# Patient Record
Sex: Female | Born: 1975
Health system: Southern US, Community
[De-identification: ages and names within clinical notes are randomized; demographics above are authoritative.]

## PROBLEM LIST (undated history)

## (undated) DIAGNOSIS — R569 Unspecified convulsions: Secondary | ICD-10-CM

## (undated) DIAGNOSIS — N200 Calculus of kidney: Secondary | ICD-10-CM

## (undated) DIAGNOSIS — J45909 Unspecified asthma, uncomplicated: Secondary | ICD-10-CM

## (undated) DIAGNOSIS — M1990 Unspecified osteoarthritis, unspecified site: Secondary | ICD-10-CM

## (undated) HISTORY — PX: TUBAL LIGATION: SHX77

## (undated) HISTORY — PX: KIDNEY STONE SURGERY: SHX686

---

## 2004-04-12 ENCOUNTER — Inpatient Hospital Stay (HOSPITAL_COMMUNITY): Admission: AD | Admit: 2004-04-12 | Discharge: 2004-04-12 | Payer: Self-pay | Admitting: Obstetrics & Gynecology

## 2004-07-03 ENCOUNTER — Ambulatory Visit (HOSPITAL_COMMUNITY): Admission: RE | Admit: 2004-07-03 | Discharge: 2004-07-03 | Payer: Self-pay | Admitting: Obstetrics

## 2004-07-30 ENCOUNTER — Emergency Department (HOSPITAL_COMMUNITY): Admission: EM | Admit: 2004-07-30 | Discharge: 2004-07-31 | Payer: Self-pay | Admitting: Emergency Medicine

## 2004-12-08 ENCOUNTER — Emergency Department (HOSPITAL_COMMUNITY): Admission: EM | Admit: 2004-12-08 | Discharge: 2004-12-09 | Payer: Self-pay | Admitting: Podiatry

## 2004-12-09 ENCOUNTER — Ambulatory Visit (HOSPITAL_COMMUNITY): Admission: RE | Admit: 2004-12-09 | Discharge: 2004-12-09 | Payer: Self-pay | Admitting: Emergency Medicine

## 2004-12-09 ENCOUNTER — Emergency Department (HOSPITAL_COMMUNITY): Admission: EM | Admit: 2004-12-09 | Discharge: 2004-12-09 | Payer: Self-pay | Admitting: Emergency Medicine

## 2005-01-21 ENCOUNTER — Emergency Department (HOSPITAL_COMMUNITY): Admission: EM | Admit: 2005-01-21 | Discharge: 2005-01-21 | Payer: Self-pay | Admitting: Family Medicine

## 2005-02-13 ENCOUNTER — Emergency Department (HOSPITAL_COMMUNITY): Admission: EM | Admit: 2005-02-13 | Discharge: 2005-02-13 | Payer: Self-pay | Admitting: Emergency Medicine

## 2005-03-25 ENCOUNTER — Emergency Department (HOSPITAL_COMMUNITY): Admission: EM | Admit: 2005-03-25 | Discharge: 2005-03-25 | Payer: Self-pay | Admitting: Family Medicine

## 2005-07-14 ENCOUNTER — Emergency Department (HOSPITAL_COMMUNITY): Admission: EM | Admit: 2005-07-14 | Discharge: 2005-07-14 | Payer: Self-pay | Admitting: Emergency Medicine

## 2005-09-02 ENCOUNTER — Emergency Department (HOSPITAL_COMMUNITY): Admission: EM | Admit: 2005-09-02 | Discharge: 2005-09-02 | Payer: Self-pay | Admitting: Emergency Medicine

## 2005-11-21 ENCOUNTER — Ambulatory Visit: Payer: Self-pay | Admitting: Family Medicine

## 2005-11-26 ENCOUNTER — Emergency Department (HOSPITAL_COMMUNITY): Admission: EM | Admit: 2005-11-26 | Discharge: 2005-11-27 | Payer: Self-pay | Admitting: Emergency Medicine

## 2006-02-07 ENCOUNTER — Emergency Department (HOSPITAL_COMMUNITY): Admission: EM | Admit: 2006-02-07 | Discharge: 2006-02-07 | Payer: Self-pay | Admitting: Family Medicine

## 2006-02-11 ENCOUNTER — Ambulatory Visit: Payer: Self-pay | Admitting: Family Medicine

## 2006-02-23 ENCOUNTER — Ambulatory Visit: Payer: Self-pay | Admitting: Family Medicine

## 2006-03-19 ENCOUNTER — Ambulatory Visit: Payer: Self-pay | Admitting: Family Medicine

## 2006-03-23 ENCOUNTER — Ambulatory Visit (HOSPITAL_COMMUNITY): Admission: RE | Admit: 2006-03-23 | Discharge: 2006-03-23 | Payer: Self-pay | Admitting: Family Medicine

## 2006-03-25 ENCOUNTER — Ambulatory Visit: Payer: Self-pay | Admitting: Nurse Practitioner

## 2006-03-31 ENCOUNTER — Ambulatory Visit: Payer: Self-pay | Admitting: Internal Medicine

## 2006-05-29 ENCOUNTER — Emergency Department (HOSPITAL_COMMUNITY): Admission: EM | Admit: 2006-05-29 | Discharge: 2006-05-29 | Payer: Self-pay | Admitting: Emergency Medicine

## 2006-07-24 ENCOUNTER — Ambulatory Visit: Payer: Self-pay | Admitting: Family Medicine

## 2006-07-29 ENCOUNTER — Ambulatory Visit (HOSPITAL_COMMUNITY): Admission: RE | Admit: 2006-07-29 | Discharge: 2006-07-29 | Payer: Self-pay | Admitting: Family Medicine

## 2006-10-16 ENCOUNTER — Emergency Department (HOSPITAL_COMMUNITY): Admission: EM | Admit: 2006-10-16 | Discharge: 2006-10-16 | Payer: Self-pay | Admitting: Emergency Medicine

## 2006-10-16 ENCOUNTER — Encounter (INDEPENDENT_AMBULATORY_CARE_PROVIDER_SITE_OTHER): Payer: Self-pay | Admitting: Emergency Medicine

## 2006-10-16 ENCOUNTER — Ambulatory Visit: Payer: Self-pay | Admitting: Surgery

## 2006-11-10 ENCOUNTER — Ambulatory Visit: Payer: Self-pay | Admitting: Internal Medicine

## 2006-11-11 ENCOUNTER — Encounter (INDEPENDENT_AMBULATORY_CARE_PROVIDER_SITE_OTHER): Payer: Self-pay | Admitting: Family Medicine

## 2006-12-02 ENCOUNTER — Ambulatory Visit: Payer: Self-pay | Admitting: Family Medicine

## 2006-12-04 ENCOUNTER — Ambulatory Visit (HOSPITAL_COMMUNITY): Admission: RE | Admit: 2006-12-04 | Discharge: 2006-12-04 | Payer: Self-pay | Admitting: Family Medicine

## 2007-02-27 ENCOUNTER — Encounter: Admission: RE | Admit: 2007-02-27 | Discharge: 2007-02-27 | Payer: Self-pay | Admitting: Orthopedic Surgery

## 2007-04-29 ENCOUNTER — Emergency Department (HOSPITAL_COMMUNITY): Admission: EM | Admit: 2007-04-29 | Discharge: 2007-04-29 | Payer: Self-pay | Admitting: Emergency Medicine

## 2007-05-14 ENCOUNTER — Encounter
Admission: RE | Admit: 2007-05-14 | Discharge: 2007-05-14 | Payer: Self-pay | Admitting: Physical Medicine and Rehabilitation

## 2007-08-31 ENCOUNTER — Emergency Department (HOSPITAL_COMMUNITY): Admission: EM | Admit: 2007-08-31 | Discharge: 2007-08-31 | Payer: Self-pay | Admitting: Emergency Medicine

## 2007-09-08 ENCOUNTER — Ambulatory Visit (HOSPITAL_COMMUNITY): Admission: RE | Admit: 2007-09-08 | Discharge: 2007-09-08 | Payer: Self-pay | Admitting: General Surgery

## 2007-09-08 ENCOUNTER — Ambulatory Visit (HOSPITAL_COMMUNITY): Admission: RE | Admit: 2007-09-08 | Discharge: 2007-09-08 | Payer: Self-pay | Admitting: Obstetrics

## 2007-09-29 ENCOUNTER — Ambulatory Visit: Payer: Self-pay | Admitting: Gastroenterology

## 2007-09-29 DIAGNOSIS — E669 Obesity, unspecified: Secondary | ICD-10-CM

## 2007-09-29 DIAGNOSIS — K5900 Constipation, unspecified: Secondary | ICD-10-CM | POA: Insufficient documentation

## 2007-10-01 ENCOUNTER — Encounter: Payer: Self-pay | Admitting: Gastroenterology

## 2007-10-01 LAB — CONVERTED CEMR LAB
AST: 24 units/L (ref 0–37)
Alkaline Phosphatase: 57 units/L (ref 39–117)
BUN: 13 mg/dL (ref 6–23)
Basophils Relative: 0.3 % (ref 0.0–3.0)
Creatinine, Ser: 0.9 mg/dL (ref 0.4–1.2)
Eosinophils Absolute: 0.2 10*3/uL (ref 0.0–0.7)
Eosinophils Relative: 2.6 % (ref 0.0–5.0)
GFR calc non Af Amer: 77 mL/min
HCT: 35.8 % — ABNORMAL LOW (ref 36.0–46.0)
Hemoglobin: 12 g/dL (ref 12.0–15.0)
MCV: 91.1 fL (ref 78.0–100.0)
Monocytes Absolute: 0.3 10*3/uL (ref 0.1–1.0)
Neutro Abs: 4.8 10*3/uL (ref 1.4–7.7)
Platelets: 206 10*3/uL (ref 150–400)
TSH: 0.58 microintl units/mL (ref 0.35–5.50)
Total Bilirubin: 0.4 mg/dL (ref 0.3–1.2)
WBC: 7.5 10*3/uL (ref 4.5–10.5)

## 2007-11-08 ENCOUNTER — Ambulatory Visit: Payer: Self-pay | Admitting: Gastroenterology

## 2008-10-04 ENCOUNTER — Encounter: Admission: RE | Admit: 2008-10-04 | Discharge: 2008-10-31 | Payer: Self-pay | Admitting: Orthopaedic Surgery

## 2008-11-14 ENCOUNTER — Ambulatory Visit: Payer: Self-pay | Admitting: Family Medicine

## 2008-11-14 LAB — CONVERTED CEMR LAB
AST: 25 units/L (ref 0–37)
Albumin: 4.3 g/dL (ref 3.5–5.2)
Alkaline Phosphatase: 65 units/L (ref 39–117)
Potassium: 4.2 meq/L (ref 3.5–5.3)
Sodium: 142 meq/L (ref 135–145)
Total Bilirubin: 0.2 mg/dL — ABNORMAL LOW (ref 0.3–1.2)
Total Protein: 7.4 g/dL (ref 6.0–8.3)
Vit D, 25-Hydroxy: 17 ng/mL — ABNORMAL LOW (ref 30–89)

## 2008-11-27 ENCOUNTER — Telehealth (INDEPENDENT_AMBULATORY_CARE_PROVIDER_SITE_OTHER): Payer: Self-pay | Admitting: *Deleted

## 2008-12-26 ENCOUNTER — Ambulatory Visit: Payer: Self-pay | Admitting: Family Medicine

## 2009-02-27 ENCOUNTER — Ambulatory Visit: Payer: Self-pay | Admitting: Family Medicine

## 2009-02-27 LAB — CONVERTED CEMR LAB
Basophils Relative: 0 % (ref 0–1)
Eosinophils Absolute: 0 10*3/uL (ref 0.0–0.7)
Eosinophils Relative: 1 % (ref 0–5)
HCT: 35.3 % — ABNORMAL LOW (ref 36.0–46.0)
Lymphs Abs: 1.8 10*3/uL (ref 0.7–4.0)
MCHC: 32.6 g/dL (ref 30.0–36.0)
MCV: 87.8 fL (ref 78.0–100.0)
Monocytes Relative: 5 % (ref 3–12)
Neutrophils Relative %: 63 % (ref 43–77)
Platelets: 219 10*3/uL (ref 150–400)
RBC: 4.02 M/uL (ref 3.87–5.11)

## 2009-03-15 ENCOUNTER — Ambulatory Visit: Payer: Self-pay | Admitting: Family Medicine

## 2009-03-15 LAB — CONVERTED CEMR LAB
Chlamydia, Swab/Urine, PCR: NEGATIVE
GC Probe Amp, Urine: NEGATIVE

## 2009-04-19 ENCOUNTER — Ambulatory Visit: Payer: Self-pay | Admitting: Family Medicine

## 2009-04-19 ENCOUNTER — Encounter (INDEPENDENT_AMBULATORY_CARE_PROVIDER_SITE_OTHER): Payer: Self-pay | Admitting: Family Medicine

## 2009-04-19 LAB — CONVERTED CEMR LAB: Prolactin: 8.6 ng/mL

## 2010-01-16 ENCOUNTER — Encounter
Admission: RE | Admit: 2010-01-16 | Discharge: 2010-01-16 | Payer: Self-pay | Source: Home / Self Care | Attending: Internal Medicine | Admitting: Internal Medicine

## 2010-02-17 ENCOUNTER — Encounter: Payer: Self-pay | Admitting: Obstetrics

## 2010-05-02 ENCOUNTER — Emergency Department (HOSPITAL_COMMUNITY): Payer: No Typology Code available for payment source

## 2010-05-02 ENCOUNTER — Emergency Department (HOSPITAL_COMMUNITY)
Admission: EM | Admit: 2010-05-02 | Discharge: 2010-05-03 | Disposition: A | Discharge status: Home / Self Care | Payer: No Typology Code available for payment source | Attending: Emergency Medicine | Admitting: Emergency Medicine

## 2010-05-02 ENCOUNTER — Emergency Department (HOSPITAL_COMMUNITY): Payer: Self-pay

## 2010-05-02 DIAGNOSIS — M5380 Other specified dorsopathies, site unspecified: Secondary | ICD-10-CM | POA: Insufficient documentation

## 2010-05-02 DIAGNOSIS — E669 Obesity, unspecified: Secondary | ICD-10-CM | POA: Insufficient documentation

## 2010-05-02 DIAGNOSIS — M545 Low back pain, unspecified: Secondary | ICD-10-CM | POA: Insufficient documentation

## 2010-05-02 DIAGNOSIS — G8929 Other chronic pain: Secondary | ICD-10-CM | POA: Insufficient documentation

## 2010-05-02 DIAGNOSIS — S139XXA Sprain of joints and ligaments of unspecified parts of neck, initial encounter: Secondary | ICD-10-CM | POA: Insufficient documentation

## 2010-05-02 DIAGNOSIS — M546 Pain in thoracic spine: Secondary | ICD-10-CM | POA: Insufficient documentation

## 2010-05-02 DIAGNOSIS — J45909 Unspecified asthma, uncomplicated: Secondary | ICD-10-CM | POA: Insufficient documentation

## 2010-05-02 DIAGNOSIS — M25559 Pain in unspecified hip: Secondary | ICD-10-CM | POA: Insufficient documentation

## 2010-05-02 DIAGNOSIS — M542 Cervicalgia: Secondary | ICD-10-CM | POA: Insufficient documentation

## 2010-05-02 DIAGNOSIS — Z86718 Personal history of other venous thrombosis and embolism: Secondary | ICD-10-CM | POA: Insufficient documentation

## 2010-05-02 DIAGNOSIS — I10 Essential (primary) hypertension: Secondary | ICD-10-CM | POA: Insufficient documentation

## 2010-05-02 DIAGNOSIS — S0990XA Unspecified injury of head, initial encounter: Secondary | ICD-10-CM | POA: Insufficient documentation

## 2010-06-05 NOTE — Group Therapy Note (Signed)
  NAMEKANDISE, Kendra Roberts             ACCOUNT NO.:  [id number]  MEDICAL RECORD NO.:  [id number]           PATIENT TYPE:  LOCATION:                                 FACILITY:  PHYSICIAN:  Tahiry Spicer L. Juanetta Gosling, M.D.DATE OF BIRTH:  May 03, 1975  DATE OF PROCEDURE: DATE OF DISCHARGE:                                PROGRESS NOTE   Ms. Bohman had surgery done on May 31, 2010, and has remained on the ventilator postop.  She continues to have what appears to be an ARDS picture on her chest x-ray, but has good blood gases.  She has no other new complaints at this point.  Her exam shows that she is intubated, sedated, and on the ventilator. Her blood pressure by art-line is 120/47, pulse 123.  Her I and O +1438 so far today, +2507 yesterday.  Weight is approximately the same.  Her lab work shows sodium is 146, CO2 is 33, glucose of 155, BUN is 34 with a creatinine of 0.5.  Her blood gas shows pH 7.41, pCO2 of 47, pO2 of 80.  This is on 40% tidal volume of 310, rate of 33 and 8 of PEEP. Chest x-ray is pending.  ASSESSMENT:  She has adult respiratory distress syndrome.  She had peritonitis.  She appears to be generally improved, but I do not think she is ready for any sort of weaning process as yet.  We will plan to continue her current treatments and medications and not change anything at this point.     Advay Volante L. Juanetta Gosling, M.D.     ELH/MEDQ  D:  06/02/2010  T:  06/02/2010  Job:  [id number]  Electronically Signed by Kari Baars M.D. on 06/05/2010 12:43:17 PM

## 2010-10-22 LAB — POCT URINALYSIS DIP (DEVICE)
Glucose, UA: NEGATIVE
Nitrite: NEGATIVE
Operator id: 247071
Protein, ur: NEGATIVE
Specific Gravity, Urine: 1.015
Urobilinogen, UA: 0.2

## 2010-10-22 LAB — WET PREP, GENITAL: Yeast Wet Prep HPF POC: NONE SEEN

## 2010-10-22 LAB — URINE CULTURE

## 2010-10-22 LAB — POCT PREGNANCY, URINE
Operator id: 247071
Preg Test, Ur: NEGATIVE

## 2010-10-22 LAB — HERPES SIMPLEX VIRUS CULTURE

## 2010-10-25 LAB — RAPID STREP SCREEN (MED CTR MEBANE ONLY): Streptococcus, Group A Screen (Direct): NEGATIVE

## 2010-11-07 LAB — I-STAT 8, (EC8 V) (CONVERTED LAB)
BUN: 8
Bicarbonate: 22
Glucose, Bld: 81
HCT: 44
Operator id: 294501
Sodium: 137
TCO2: 23
pCO2, Ven: 38 — ABNORMAL LOW

## 2010-11-07 LAB — POCT URINALYSIS DIP (DEVICE)
Glucose, UA: NEGATIVE
Ketones, ur: NEGATIVE
Operator id: 239701
Protein, ur: NEGATIVE
Specific Gravity, Urine: 1.015

## 2010-11-07 LAB — D-DIMER, QUANTITATIVE: D-Dimer, Quant: 0.86 — ABNORMAL HIGH

## 2010-11-07 LAB — POCT I-STAT CREATININE: Operator id: 294501

## 2010-12-30 ENCOUNTER — Emergency Department (HOSPITAL_COMMUNITY)
Admission: EM | Admit: 2010-12-30 | Discharge: 2010-12-30 | Disposition: A | Discharge status: Home / Self Care | Payer: Medicare Other | Attending: Emergency Medicine | Admitting: Emergency Medicine

## 2010-12-30 ENCOUNTER — Encounter: Payer: Self-pay | Admitting: *Deleted

## 2010-12-30 ENCOUNTER — Emergency Department (HOSPITAL_COMMUNITY): Payer: Medicare Other

## 2010-12-30 DIAGNOSIS — D649 Anemia, unspecified: Secondary | ICD-10-CM | POA: Insufficient documentation

## 2010-12-30 DIAGNOSIS — X58XXXA Exposure to other specified factors, initial encounter: Secondary | ICD-10-CM | POA: Insufficient documentation

## 2010-12-30 DIAGNOSIS — M546 Pain in thoracic spine: Secondary | ICD-10-CM | POA: Insufficient documentation

## 2010-12-30 DIAGNOSIS — R109 Unspecified abdominal pain: Secondary | ICD-10-CM | POA: Insufficient documentation

## 2010-12-30 DIAGNOSIS — N390 Urinary tract infection, site not specified: Secondary | ICD-10-CM | POA: Insufficient documentation

## 2010-12-30 DIAGNOSIS — R0602 Shortness of breath: Secondary | ICD-10-CM | POA: Insufficient documentation

## 2010-12-30 DIAGNOSIS — R079 Chest pain, unspecified: Secondary | ICD-10-CM | POA: Insufficient documentation

## 2010-12-30 DIAGNOSIS — K5900 Constipation, unspecified: Secondary | ICD-10-CM | POA: Insufficient documentation

## 2010-12-30 DIAGNOSIS — T148XXA Other injury of unspecified body region, initial encounter: Secondary | ICD-10-CM | POA: Insufficient documentation

## 2010-12-30 LAB — COMPREHENSIVE METABOLIC PANEL
ALT: 24 U/L (ref 0–35)
AST: 34 U/L (ref 0–37)
Albumin: 3.2 g/dL — ABNORMAL LOW (ref 3.5–5.2)
Alkaline Phosphatase: 82 U/L (ref 39–117)
BUN: 11 mg/dL (ref 6–23)
CO2: 23 mEq/L (ref 19–32)
Calcium: 9.1 mg/dL (ref 8.4–10.5)
Chloride: 103 mEq/L (ref 96–112)
Creatinine, Ser: 0.8 mg/dL (ref 0.50–1.10)
GFR calc Af Amer: >90 mL/min (ref >90)
GFR calc non Af Amer: >90 mL/min (ref >90)
Glucose, Bld: 94 mg/dL (ref 70–99)
Potassium: 3.5 mEq/L (ref 3.5–5.1)
Sodium: 137 mEq/L (ref 135–145)
Total Bilirubin: 0.3 mg/dL (ref 0.3–1.2)
Total Protein: 7.7 g/dL (ref 6.0–8.3)

## 2010-12-30 LAB — URINALYSIS, ROUTINE W REFLEX MICROSCOPIC
Nitrite: NEGATIVE
Specific Gravity, Urine: 1.017 (ref 1.005–1.030)
Urobilinogen, UA: 4 mg/dL — ABNORMAL HIGH (ref 0.0–1.0)
pH: 7 (ref 5.0–8.0)

## 2010-12-30 LAB — CBC
HCT: 31 % — ABNORMAL LOW (ref 36.0–46.0)
Hemoglobin: 10.7 g/dL — ABNORMAL LOW (ref 12.0–15.0)
MCH: 28.8 pg (ref 26.0–34.0)
MCHC: 34.5 g/dL (ref 30.0–36.0)
MCV: 83.3 fL (ref 78.0–100.0)
Platelets: 209 10*3/uL (ref 150–400)
RBC: 3.72 MIL/uL — ABNORMAL LOW (ref 3.87–5.11)
RDW: 13.6 % (ref 11.5–15.5)
WBC: 10.4 10*3/uL (ref 4.0–10.5)

## 2010-12-30 LAB — DIFFERENTIAL
Basophils Absolute: 0 10*3/uL (ref 0.0–0.1)
Basophils Relative: 0 % (ref 0–1)
Eosinophils Absolute: 0 10*3/uL (ref 0.0–0.7)
Eosinophils Relative: 0 % (ref 0–5)
Lymphocytes Relative: 22 % (ref 12–46)
Lymphs Abs: 2.3 10*3/uL (ref 0.7–4.0)
Monocytes Absolute: 1.2 10*3/uL — ABNORMAL HIGH (ref 0.1–1.0)
Monocytes Relative: 11 % (ref 3–12)
Neutro Abs: 6.9 10*3/uL (ref 1.7–7.7)
Neutrophils Relative %: 66 % (ref 43–77)

## 2010-12-30 LAB — URINE MICROSCOPIC-ADD ON

## 2010-12-30 LAB — PREGNANCY, URINE: Preg Test, Ur: NEGATIVE

## 2010-12-30 LAB — LIPASE, BLOOD: Lipase: 22 U/L (ref 11–59)

## 2010-12-30 MED ORDER — NITROFURANTOIN MONOHYD MACRO 100 MG PO CAPS: 100.0000 mg | ORAL_CAPSULE | Freq: Two times a day (BID) | ORAL | Status: AC

## 2010-12-30 MED ORDER — HYDROCODONE-ACETAMINOPHEN 7.5-325 MG/15ML PO SOLN: 15.0000 mL | ORAL | Status: AC | PRN

## 2010-12-30 MED ORDER — CYCLOBENZAPRINE HCL 10 MG PO TABS: 10.0000 mg | ORAL_TABLET | Freq: Three times a day (TID) | ORAL | Status: AC | PRN

## 2010-12-30 MED ORDER — HYDROCODONE-ACETAMINOPHEN 5-325 MG PO TABS
1.0000 | ORAL_TABLET | Freq: Once | ORAL | Status: AC
  Administered 2010-12-30: 1 via ORAL
  Filled 2010-12-30: qty 1

## 2010-12-30 MED ORDER — IBUPROFEN 800 MG PO TABS
800.0000 mg | ORAL_TABLET | Freq: Once | ORAL | Status: AC
  Administered 2010-12-30: 800 mg via ORAL
  Filled 2010-12-30: qty 1

## 2010-12-30 MED ORDER — IBUPROFEN 400 MG PO TABS: 400.0000 mg | ORAL_TABLET | Freq: Three times a day (TID) | ORAL | Status: AC | PRN

## 2010-12-30 NOTE — ED Provider Notes (Signed)
History     CSN: [id number] Arrival date & time: 12/30/2010  5:02 PM   First MD Initiated Contact with Patient 12/30/10 2123      Chief Complaint  Patient presents with  . Flank Pain    HPI: Patient is a 35 y.o. female presenting with flank pain. The history is provided by the patient.  Flank Pain This is a new problem. The current episode started in the past 7 days. The problem occurs constantly. The problem has been gradually worsening. Pertinent negatives include no abdominal pain, chest pain, chills, coughing, fever, nausea, urinary symptoms or vomiting.   patient reports approximately one and a half week history of right rib area pain that radiates around to the right upper back. Pain is worse with movement, palpation, coughing and sneezing. Denies injury. Denies fever cough nausea vomiting diarrhea or other associated symptoms. Does state that she has had problems with constipation. States last BM was more than a week ago.  History reviewed. No pertinent past medical history.  History reviewed. No pertinent past surgical history.  History reviewed. No pertinent family history.  History  Substance Use Topics  . Smoking status: Not on file  . Smokeless tobacco: Not on file  . Alcohol Use: Not on file    OB History    Grav Para Term Preterm Abortions TAB SAB Ect Mult Living                  Review of Systems  Constitutional: Negative for fever and chills.  Respiratory: Negative for cough.   Cardiovascular: Negative for chest pain.  Gastrointestinal: Negative for nausea, vomiting and abdominal pain.  Genitourinary: Positive for flank pain.    Allergies  Penicillins  Home Medications   Current Outpatient Rx  Name Route Sig Dispense Refill  . HYDROXYZINE HCL 10 MG PO TABS Oral Take 10 mg by mouth 3 (three) times daily as needed. For itching.       BP 121/77  Pulse 96  Temp(Src) 98.6 F (37 C) (Oral)  Resp 20  SpO2 97%  Physical Exam  ED Course    Procedures Findings and plan discussed w/ pt. Admits that she has been told in the past that she was anemic. 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 Will plan for discharge home with treatment for muscle strain and urinary tract infection, encourage her to start daily Iron and encourage close followup with primary care physician. Patient is agreeable with plan. Labs Reviewed  URINALYSIS, ROUTINE W REFLEX MICROSCOPIC - Abnormal; Notable for the following:    APPearance CLOUDY (*)    Hgb urine dipstick SMALL (*)    Ketones, ur 15 (*)    Protein, ur 30 (*)    Urobilinogen, UA 4.0 (*)    Leukocytes, UA LARGE (*)    All other components within normal limits  URINE MICROSCOPIC-ADD ON - Abnormal; Notable for the following:    Bacteria, UA MANY (*)    All other components within normal limits  CBC - Abnormal; Notable for the following:    RBC 3.72 (*)    Hemoglobin 10.7 (*)    HCT 31.0 (*)    All other components within normal limits  DIFFERENTIAL - Abnormal; Notable for the following:    Monocytes Absolute 1.2 (*)    All other components within normal limits  COMPREHENSIVE METABOLIC PANEL - Abnormal; Notable for the following:    Albumin 3.2 (*)    All other  components within normal limits  PREGNANCY, URINE  LIPASE, BLOOD   Dg Chest 2 View  12/30/2010  *RADIOLOGY REPORT*  Clinical Data: Right-sided pleuritic chest pain for 2 weeks and shortness of breath; history of smoking and asthma.  CHEST - 2 VIEW  Comparison: Chest radiograph performed 03/25/2005, and thoracic spine radiograph performed 05/02/2010  Findings: The lungs are well-aerated and clear.  There is no evidence of focal opacification, pleural effusion or pneumothorax.  The heart is normal in size; the mediastinal contour is within normal limits.  No acute osseous abnormalities are seen.  IMPRESSION: No acute cardiopulmonary process seen; no displaced rib fractures identified.  Original Report  Authenticated By: Tonia Ghent, M.D.     No diagnosis found.    MDM  History of present illness, physical exam and clinical findings consistent with muscle strain as a source of  patient's pain. Incidental findings of anemia which is not new for this patient, and urinary tract infection        Leanne Chang, NP 12/30/10 2245

## 2010-12-30 NOTE — ED Notes (Signed)
To ed for eval of right flank pain. States pain increases with movement, urination, menses, bm.states she had blood in her emesis last week.

## 2011-01-04 NOTE — ED Provider Notes (Signed)
Medical screening examination/treatment/procedure(s) were performed by non-physician practitioner and as supervising physician I was immediately available for consultation/collaboration.   Suzi Roots, MD 01/04/11 [phone]

## 2011-02-06 DIAGNOSIS — F17200 Nicotine dependence, unspecified, uncomplicated: Secondary | ICD-10-CM | POA: Diagnosis not present

## 2011-02-06 DIAGNOSIS — R0989 Other specified symptoms and signs involving the circulatory and respiratory systems: Secondary | ICD-10-CM | POA: Diagnosis not present

## 2011-02-06 DIAGNOSIS — Z23 Encounter for immunization: Secondary | ICD-10-CM | POA: Diagnosis not present

## 2011-02-06 DIAGNOSIS — E785 Hyperlipidemia, unspecified: Secondary | ICD-10-CM | POA: Diagnosis not present

## 2011-02-06 DIAGNOSIS — J3089 Other allergic rhinitis: Secondary | ICD-10-CM | POA: Diagnosis not present

## 2011-02-06 DIAGNOSIS — R0609 Other forms of dyspnea: Secondary | ICD-10-CM | POA: Diagnosis not present

## 2011-02-06 DIAGNOSIS — E559 Vitamin D deficiency, unspecified: Secondary | ICD-10-CM | POA: Diagnosis not present

## 2011-02-19 DIAGNOSIS — R0609 Other forms of dyspnea: Secondary | ICD-10-CM | POA: Diagnosis not present

## 2011-02-19 DIAGNOSIS — R1013 Epigastric pain: Secondary | ICD-10-CM | POA: Diagnosis not present

## 2011-02-19 DIAGNOSIS — R7301 Impaired fasting glucose: Secondary | ICD-10-CM | POA: Diagnosis not present

## 2011-02-19 DIAGNOSIS — R0989 Other specified symptoms and signs involving the circulatory and respiratory systems: Secondary | ICD-10-CM | POA: Diagnosis not present

## 2011-03-19 DIAGNOSIS — R1013 Epigastric pain: Secondary | ICD-10-CM | POA: Diagnosis not present

## 2011-03-19 DIAGNOSIS — R7301 Impaired fasting glucose: Secondary | ICD-10-CM | POA: Diagnosis not present

## 2011-07-05 ENCOUNTER — Emergency Department (INDEPENDENT_AMBULATORY_CARE_PROVIDER_SITE_OTHER)
Admission: EM | Admit: 2011-07-05 | Discharge: 2011-07-05 | Disposition: A | Discharge status: Home / Self Care | Payer: Medicare Other | Source: Home / Self Care | Attending: Emergency Medicine | Admitting: Emergency Medicine

## 2011-07-05 ENCOUNTER — Encounter (HOSPITAL_COMMUNITY): Payer: Self-pay

## 2011-07-05 DIAGNOSIS — L03119 Cellulitis of unspecified part of limb: Secondary | ICD-10-CM

## 2011-07-05 DIAGNOSIS — L02419 Cutaneous abscess of limb, unspecified: Secondary | ICD-10-CM

## 2011-07-05 HISTORY — DX: Unspecified osteoarthritis, unspecified site: M19.90

## 2011-07-05 MED ORDER — HYDROCODONE-ACETAMINOPHEN 5-325 MG PO TABS: 2.0000 | ORAL_TABLET | Freq: Four times a day (QID) | ORAL | Status: AC | PRN

## 2011-07-05 MED ORDER — DOXYCYCLINE HYCLATE 100 MG PO CAPS: 100.0000 mg | ORAL_CAPSULE | Freq: Two times a day (BID) | ORAL | Status: AC

## 2011-07-05 NOTE — ED Provider Notes (Signed)
History     CSN: [id number]  Arrival date & time 07/05/11  1430   None     Chief Complaint  Patient presents with  . Leg Swelling    (Consider location/radiation/quality/duration/timing/severity/associated sxs/prior treatment) HPI Comments: Pt had tattoo on L lower leg "redone" 3 weeks ago.  1.5 weeks ago, noticed "pimple" on leg, squeezed it and some pus came out.  Gradually area became bigger, more red, painful; now entire lower leg is swollen and painful.  Pt saw PCP on 6/6 and was rx keflex.  Pt feels it isn't helping and sx worsening, pcp advised pt to come here.   Patient is a 36 y.o. female presenting with abscess. The history is provided by the patient.  Abscess  This is a new problem. The current episode started more than one week ago. The onset was gradual. The problem occurs continuously. The problem has been gradually worsening. The abscess is present on the left lower leg. The problem is severe. The abscess is characterized by painfulness, burning, draining and swelling. It is unknown what she was exposed to. Pertinent negatives include no fever and no vomiting. Recently, medical care has been given by the PCP. Services received include medications given.    Past Medical History  Diagnosis Date  . Arthritis     History reviewed. No pertinent past surgical history.  History reviewed. No pertinent family history.  History  Substance Use Topics  . Smoking status: Never Smoker   . Smokeless tobacco: Not on file  . Alcohol Use: Yes    OB History    Grav Para Term Preterm Abortions TAB SAB Ect Mult Living                  Review of Systems  Constitutional: Positive for chills. Negative for fever.  Gastrointestinal: Positive for nausea. Negative for vomiting.  Skin: Positive for color change.       Swelling, pus, red skin, painful skin    Allergies  Penicillins  Home Medications   Current Outpatient Rx  Name Route Sig Dispense Refill  . DOXYCYCLINE  HYCLATE 100 MG PO CAPS Oral Take 1 capsule (100 mg total) by mouth 2 (two) times daily. 20 capsule 0  . HYDROCODONE-ACETAMINOPHEN 5-325 MG PO TABS Oral Take 2 tablets by mouth every 6 (six) hours as needed for pain. 10 tablet 0  . HYDROXYZINE HCL 10 MG PO TABS Oral Take 10 mg by mouth 3 (three) times daily as needed. For itching.       BP 135/81  Pulse 92  Temp(Src) 99.1 F (37.3 C) (Oral)  Resp 21  SpO2 100%  Physical Exam  Constitutional: She appears well-developed and well-nourished.       Uncomfortable appearing  Cardiovascular: Normal rate and regular rhythm.   Pulses:      Dorsalis pedis pulses are 2+ on the left side.  Pulmonary/Chest: Effort normal. She has wheezes in the right upper field.       Faint exp wheezing RUL  Abdominal: Bowel sounds are normal. She exhibits no distension. There is no tenderness.  Musculoskeletal:       Left lower leg: She exhibits tenderness and edema. She exhibits no bony tenderness.       2+ pitting edema LLE  Skin: Skin is warm and dry.       ED Course  Procedures (including critical care time)   Labs Reviewed  CULTURE, ROUTINE-ABSCESS   No results found.   1. Cellulitis and abscess  of leg       MDM   Dr. Ladon Applebaum examined pt with me. Will add doxycycline, pt to f/u with pcp 6/10.          Cathlyn Parsons, NP 07/05/11 1729

## 2011-07-05 NOTE — Discharge Instructions (Signed)
Call Dr. Rubye Oaks office to see him on Monday to have your legs rechecked.   Cellulitis Cellulitis is an infection of the tissue under the skin. The infected area is usually red and tender. This is caused by germs. These germs enter the body through cuts or sores. This usually happens in the arms or lower legs. HOME CARE   Take your medicine as told. Finish it even if you start to feel better.   If the infection is on the arm or leg, keep it raised (elevated).   Use a warm cloth on the infected area several times a day.   See your doctor for a follow-up visit as told.  GET HELP RIGHT AWAY IF:   You are tired or confused.   You throw up (vomit).   You have watery poop (diarrhea).   You feel ill and have muscle aches.   You have a fever.  MAKE SURE YOU:   Understand these instructions.   Will watch your condition.   Will get help right away if you are not doing well or get worse.  Document Released: 07/02/2007 Document Revised: 01/02/2011 Document Reviewed: 12/15/2008 [facility] Patient Information 2012 [place], [place]. Abscess An abscess (boil or furuncle) is an infected area under your skin. This area is filled with yellowish white fluid (pus). HOME CARE   Only take medicine as told by your doctor.   Keep the skin clean around your abscess. Keep clothes that may touch the abscess clean.   Change any bandages (dressings) as told by your doctor.   Avoid direct skin contact with other people. The infection can spread by skin contact with others.   Practice good hygiene and do not share personal care items.   Do not share athletic equipment, towels, or whirlpools. Shower after every practice or work out session.   If a draining area cannot be covered:   Do not play sports.   Children should not go to daycare until the wound has healed or until fluid (drainage) stops coming out of the wound.   See your doctor for a follow-up visit as told.  GET HELP RIGHT AWAY IF:    There is more pain, puffiness (swelling), and redness in the wound site.   There is fluid or bleeding from the wound site.   You have muscle aches, chills, fever, or feel sick.   You or your child has a temperature by mouth above 102 F (38.9 C), not controlled by medicine.   Your baby is older than 3 months with a rectal temperature of 102 F (38.9 C) or higher.  MAKE SURE YOU:   Understand these instructions.   Will watch your condition.   Will get help right away if you are not doing well or get worse.  Document Released: 07/02/2007 Document Revised: 01/02/2011 Document Reviewed: 07/02/2007 [facility] Patient Information 2012 [place], [place].

## 2011-07-05 NOTE — ED Notes (Signed)
Pt was seen at PCP on Thursday and given keflex for lesion on lt lower leg.  She now has another lesion and pain and swelling from knee to ankle.

## 2011-07-05 NOTE — ED Provider Notes (Signed)
Medical screening examination/treatment/procedure(s) were performed by non-physician practitioner and as supervising physician I was immediately available for consultation/collaboration.  Raynald Blend, MD 07/05/11 1733

## 2011-07-08 LAB — CULTURE, ROUTINE-ABSCESS: Gram Stain: NONE SEEN

## 2011-08-07 DIAGNOSIS — J45909 Unspecified asthma, uncomplicated: Secondary | ICD-10-CM | POA: Diagnosis not present

## 2011-08-07 DIAGNOSIS — R7301 Impaired fasting glucose: Secondary | ICD-10-CM | POA: Diagnosis not present

## 2011-09-10 DIAGNOSIS — J45909 Unspecified asthma, uncomplicated: Secondary | ICD-10-CM | POA: Diagnosis not present

## 2011-09-10 DIAGNOSIS — R7301 Impaired fasting glucose: Secondary | ICD-10-CM | POA: Diagnosis not present

## 2011-10-14 DIAGNOSIS — M2550 Pain in unspecified joint: Secondary | ICD-10-CM | POA: Diagnosis not present

## 2011-10-14 DIAGNOSIS — R7301 Impaired fasting glucose: Secondary | ICD-10-CM | POA: Diagnosis not present

## 2011-12-04 DIAGNOSIS — G63 Polyneuropathy in diseases classified elsewhere: Secondary | ICD-10-CM | POA: Diagnosis not present

## 2012-04-09 DIAGNOSIS — R7301 Impaired fasting glucose: Secondary | ICD-10-CM | POA: Diagnosis not present

## 2012-06-11 DIAGNOSIS — E559 Vitamin D deficiency, unspecified: Secondary | ICD-10-CM | POA: Diagnosis not present

## 2012-06-11 DIAGNOSIS — J45909 Unspecified asthma, uncomplicated: Secondary | ICD-10-CM | POA: Diagnosis not present

## 2012-06-11 DIAGNOSIS — F411 Generalized anxiety disorder: Secondary | ICD-10-CM | POA: Diagnosis not present

## 2012-06-11 DIAGNOSIS — R7301 Impaired fasting glucose: Secondary | ICD-10-CM | POA: Diagnosis not present

## 2012-07-08 ENCOUNTER — Emergency Department (HOSPITAL_COMMUNITY)
Admission: EM | Admit: 2012-07-08 | Discharge: 2012-07-08 | Disposition: A | Discharge status: Home / Self Care | Payer: Medicare Other | Attending: Emergency Medicine | Admitting: Emergency Medicine

## 2012-07-08 ENCOUNTER — Encounter (HOSPITAL_COMMUNITY): Payer: Self-pay

## 2012-07-08 ENCOUNTER — Emergency Department (HOSPITAL_COMMUNITY): Payer: Medicare Other

## 2012-07-08 DIAGNOSIS — S8990XA Unspecified injury of unspecified lower leg, initial encounter: Secondary | ICD-10-CM | POA: Insufficient documentation

## 2012-07-08 DIAGNOSIS — Z8739 Personal history of other diseases of the musculoskeletal system and connective tissue: Secondary | ICD-10-CM | POA: Diagnosis not present

## 2012-07-08 DIAGNOSIS — S8991XA Unspecified injury of right lower leg, initial encounter: Secondary | ICD-10-CM

## 2012-07-08 DIAGNOSIS — S99929A Unspecified injury of unspecified foot, initial encounter: Secondary | ICD-10-CM | POA: Insufficient documentation

## 2012-07-08 DIAGNOSIS — Y929 Unspecified place or not applicable: Secondary | ICD-10-CM | POA: Insufficient documentation

## 2012-07-08 DIAGNOSIS — Y9389 Activity, other specified: Secondary | ICD-10-CM | POA: Insufficient documentation

## 2012-07-08 DIAGNOSIS — X503XXA Overexertion from repetitive movements, initial encounter: Secondary | ICD-10-CM | POA: Insufficient documentation

## 2012-07-08 DIAGNOSIS — Z880 Allergy status to penicillin: Secondary | ICD-10-CM | POA: Diagnosis not present

## 2012-07-08 MED ORDER — PREDNISONE 10 MG PO TABS: 10.0000 mg | ORAL_TABLET | Freq: Every day | ORAL | Status: DC

## 2012-07-08 MED ORDER — HYDROCODONE-ACETAMINOPHEN 5-325 MG PO TABS: 1.0000 | ORAL_TABLET | Freq: Four times a day (QID) | ORAL | Status: DC | PRN

## 2012-07-08 MED ORDER — HYDROCODONE-ACETAMINOPHEN 5-325 MG PO TABS: 1.0000 | ORAL_TABLET | ORAL | Status: DC | PRN

## 2012-07-08 MED ORDER — HYDROCODONE-ACETAMINOPHEN 5-325 MG PO TABS
1.0000 | ORAL_TABLET | Freq: Once | ORAL | Status: AC
  Administered 2012-07-08: 1 via ORAL
  Filled 2012-07-08: qty 1

## 2012-07-08 NOTE — ED Notes (Signed)
Per patient 3 days ago she was playing with son and twisted her knee backwards.  She heard and felt a "pop" on inside of her knee.  She has treated with ice but pain increasing.  States she was in shower this morning and knee gave out and would not hold weight.  Pt alert oriented X4

## 2012-07-08 NOTE — ED Notes (Signed)
Pt presents with 3 day h/o R knee pain.  Pt reports twisting injury and feeling a "pop", and reports initial swelling, pt is able to bear weight.

## 2012-07-08 NOTE — ED Notes (Signed)
PT ambulated with baseline gait; VSS; A&Ox3; no signs of distress; respirations even and unlabored; skin warm and dry; no questions upon discharge.  

## 2012-07-08 NOTE — ED Provider Notes (Signed)
History     CSN: [id number]  Arrival date & time 07/08/12  1307   First MD Initiated Contact with Patient 07/08/12 1405      No chief complaint on file.   (Consider location/radiation/quality/duration/timing/severity/associated sxs/prior treatment) HPI  Patient to the ED with complaints of right knee pain after injury 3 days ago. She was playing with her daughter and said that it twisted the wrong way and she heard a pop, the knee is swollen and painful. She is able to bear weight but it hurts no matter which ways she moves it.   Past Medical History  Diagnosis Date  . Arthritis     History reviewed. No pertinent past surgical history.  History reviewed. No pertinent family history.  History  Substance Use Topics  . Smoking status: Never Smoker   . Smokeless tobacco: Not on file  . Alcohol Use: Yes    OB History   Grav Para Term Preterm Abortions TAB SAB Ect Mult Living                  Review of Systems  Musculoskeletal: Positive for joint swelling.  All other systems reviewed and are negative.    Allergies  Penicillins  Home Medications   Current Outpatient Rx  Name  Route  Sig  Dispense  Refill  . albuterol (PROVENTIL HFA;VENTOLIN HFA) 108 (90 BASE) MCG/ACT inhaler   Inhalation   Inhale 2 puffs into the lungs every 6 (six) hours as needed for wheezing.         . montelukast (SINGULAIR) 10 MG tablet   Oral   Take 10 mg by mouth at bedtime.         Marland Kitchen HYDROcodone-acetaminophen (NORCO/VICODIN) 5-325 MG per tablet   Oral   Take 1 tablet by mouth every 6 (six) hours as needed for pain.   15 tablet   0   . predniSONE (DELTASONE) 10 MG tablet   Oral   Take 1 tablet (10 mg total) by mouth daily.   21 tablet   0     Prednisone dose pack directions:   6 tabs on day ...     BP 121/70  Pulse 86  Temp(Src) 97.9 F (36.6 C) (Oral)  Resp 16  SpO2 100%  LMP 07/08/2012  Physical Exam  Nursing note and vitals reviewed. Constitutional: She  appears well-developed and well-nourished. No distress.  HENT:  Head: Normocephalic and atraumatic.  Eyes: Pupils are equal, round, and reactive to light.  Neck: Normal range of motion. Neck supple.  Cardiovascular: Normal rate and regular rhythm.   Pulmonary/Chest: Effort normal.  Abdominal: Soft.  Musculoskeletal:       Right knee: She exhibits decreased range of motion, swelling and effusion. She exhibits no ecchymosis, no deformity, no laceration, no erythema, normal alignment, no LCL laxity and normal patellar mobility. Tenderness found. Medial joint line and lateral joint line tenderness noted.       Legs: Neurological: She is alert.  Skin: Skin is warm and dry.    ED Course  Procedures (including critical care time)  Labs Reviewed - No data to display Dg Knee 2 Views Right  07/08/2012   *RADIOLOGY REPORT*  Clinical Data: Injury, fall  RIGHT KNEE - 1-2 VIEW  Comparison: None.  Findings: Two views of the right knee submitted.  No acute fracture or subluxation.  No joint effusion.  IMPRESSION: No acute fracture or subluxation.   Original Report Authenticated By: Natasha Mead, M.D.  1. Knee injury, right, initial encounter       MDM  Xray negative.  Given crutches and knee sleeve.  Referral to Ortho. Pain medications.  37 y.o.Angelmarie A Tellez's evaluation in the Emergency Department is complete. It has been determined that no acute conditions requiring further emergency intervention are present at this time. The patient/guardian have been advised of the diagnosis and plan. We have discussed signs and symptoms that warrant return to the ED, such as changes or worsening in symptoms.  Vital signs are stable at discharge. Filed Vitals:   07/08/12 1314  BP: 121/70  Pulse: 86  Temp: 97.9 F (36.6 C)  Resp: 16    Patient/guardian has voiced understanding and agreed to follow-up with the PCP or specialist.         Dorthula Matas, PA-C 07/08/12 1453

## 2012-07-08 NOTE — ED Provider Notes (Signed)
Medical screening examination/treatment/procedure(s) were performed by non-physician practitioner and as supervising physician I was immediately available for consultation/collaboration.   Loren Racer, MD 07/08/12 1601

## 2012-07-31 DIAGNOSIS — M1710 Unilateral primary osteoarthritis, unspecified knee: Secondary | ICD-10-CM | POA: Diagnosis not present

## 2012-08-16 ENCOUNTER — Ambulatory Visit: Payer: Medicare Other | Attending: Specialist | Admitting: Physical Therapy

## 2012-08-16 DIAGNOSIS — M6281 Muscle weakness (generalized): Secondary | ICD-10-CM | POA: Insufficient documentation

## 2012-08-16 DIAGNOSIS — IMO0001 Reserved for inherently not codable concepts without codable children: Secondary | ICD-10-CM | POA: Insufficient documentation

## 2012-08-16 DIAGNOSIS — M2550 Pain in unspecified joint: Secondary | ICD-10-CM | POA: Insufficient documentation

## 2012-08-16 DIAGNOSIS — R262 Difficulty in walking, not elsewhere classified: Secondary | ICD-10-CM | POA: Diagnosis not present

## 2012-08-16 DIAGNOSIS — M25569 Pain in unspecified knee: Secondary | ICD-10-CM | POA: Diagnosis not present

## 2012-08-23 ENCOUNTER — Ambulatory Visit: Payer: Medicare Other | Admitting: Physical Therapy

## 2012-08-26 ENCOUNTER — Ambulatory Visit: Payer: Medicare Other | Admitting: Physical Therapy

## 2012-08-27 DIAGNOSIS — G63 Polyneuropathy in diseases classified elsewhere: Secondary | ICD-10-CM | POA: Diagnosis not present

## 2012-08-27 DIAGNOSIS — R7301 Impaired fasting glucose: Secondary | ICD-10-CM | POA: Diagnosis not present

## 2012-08-30 ENCOUNTER — Ambulatory Visit: Payer: Medicare Other | Attending: Specialist | Admitting: Physical Therapy

## 2012-08-30 DIAGNOSIS — IMO0001 Reserved for inherently not codable concepts without codable children: Secondary | ICD-10-CM | POA: Insufficient documentation

## 2012-08-30 DIAGNOSIS — R262 Difficulty in walking, not elsewhere classified: Secondary | ICD-10-CM | POA: Diagnosis not present

## 2012-08-30 DIAGNOSIS — M25569 Pain in unspecified knee: Secondary | ICD-10-CM | POA: Diagnosis not present

## 2012-08-30 DIAGNOSIS — M6281 Muscle weakness (generalized): Secondary | ICD-10-CM | POA: Insufficient documentation

## 2012-08-30 DIAGNOSIS — M2550 Pain in unspecified joint: Secondary | ICD-10-CM | POA: Insufficient documentation

## 2012-09-01 ENCOUNTER — Ambulatory Visit: Payer: Medicare Other | Admitting: Physical Therapy

## 2012-09-13 ENCOUNTER — Ambulatory Visit: Payer: Medicare Other | Admitting: Physical Therapy

## 2012-09-20 ENCOUNTER — Ambulatory Visit: Payer: Medicare Other | Admitting: Physical Therapy

## 2012-09-23 ENCOUNTER — Ambulatory Visit: Payer: Medicare Other | Admitting: Physical Therapy

## 2012-09-23 DIAGNOSIS — F411 Generalized anxiety disorder: Secondary | ICD-10-CM | POA: Diagnosis not present

## 2012-09-23 DIAGNOSIS — R1110 Vomiting, unspecified: Secondary | ICD-10-CM | POA: Diagnosis not present

## 2012-09-29 ENCOUNTER — Encounter (HOSPITAL_COMMUNITY): Payer: Self-pay | Admitting: Emergency Medicine

## 2012-09-29 ENCOUNTER — Encounter: Payer: Medicare Other | Admitting: Physical Therapy

## 2012-09-29 ENCOUNTER — Emergency Department (HOSPITAL_COMMUNITY)
Admission: EM | Admit: 2012-09-29 | Discharge: 2012-09-29 | Disposition: A | Discharge status: Home / Self Care | Payer: Medicare Other | Attending: Emergency Medicine | Admitting: Emergency Medicine

## 2012-09-29 DIAGNOSIS — Z23 Encounter for immunization: Secondary | ICD-10-CM | POA: Insufficient documentation

## 2012-09-29 DIAGNOSIS — Z91040 Latex allergy status: Secondary | ICD-10-CM | POA: Insufficient documentation

## 2012-09-29 DIAGNOSIS — M129 Arthropathy, unspecified: Secondary | ICD-10-CM | POA: Insufficient documentation

## 2012-09-29 DIAGNOSIS — J45909 Unspecified asthma, uncomplicated: Secondary | ICD-10-CM | POA: Diagnosis not present

## 2012-09-29 DIAGNOSIS — Z79899 Other long term (current) drug therapy: Secondary | ICD-10-CM | POA: Insufficient documentation

## 2012-09-29 DIAGNOSIS — IMO0002 Reserved for concepts with insufficient information to code with codable children: Secondary | ICD-10-CM | POA: Insufficient documentation

## 2012-09-29 DIAGNOSIS — W268XXA Contact with other sharp object(s), not elsewhere classified, initial encounter: Secondary | ICD-10-CM | POA: Insufficient documentation

## 2012-09-29 DIAGNOSIS — Z880 Allergy status to penicillin: Secondary | ICD-10-CM | POA: Diagnosis not present

## 2012-09-29 DIAGNOSIS — S61411A Laceration without foreign body of right hand, initial encounter: Secondary | ICD-10-CM

## 2012-09-29 DIAGNOSIS — Y929 Unspecified place or not applicable: Secondary | ICD-10-CM | POA: Insufficient documentation

## 2012-09-29 DIAGNOSIS — Y9389 Activity, other specified: Secondary | ICD-10-CM | POA: Insufficient documentation

## 2012-09-29 DIAGNOSIS — S61409A Unspecified open wound of unspecified hand, initial encounter: Secondary | ICD-10-CM | POA: Insufficient documentation

## 2012-09-29 HISTORY — DX: Unspecified asthma, uncomplicated: J45.909

## 2012-09-29 MED ORDER — TETANUS-DIPHTH-ACELL PERTUSSIS 5-2.5-18.5 LF-MCG/0.5 IM SUSP
0.5000 mL | Freq: Once | INTRAMUSCULAR | Status: AC
  Administered 2012-09-29: 0.5 mL via INTRAMUSCULAR
  Filled 2012-09-29: qty 0.5

## 2012-09-29 NOTE — ED Notes (Signed)
Pt c/o right hand laceration; 2 small superficial lacerations noted to palm of right hand; bleeding controlled; unsure of last DT

## 2012-09-29 NOTE — ED Notes (Signed)
Two small lacerations to palm of right hand. Bleeding controlled. Cut when she swung her hand against a glass bowl.

## 2012-09-29 NOTE — ED Provider Notes (Signed)
CSN: [id number]     Arrival date & time 09/29/12  1359 History  This chart was scribed for non-physician practitioner working with Candyce Churn, MD by Leone Payor, ED Scribe. This patient was seen in room TR07C/TR07C and the patient's care was started at 1359.  Chief Complaint  Patient presents with  . Extremity Laceration    The history is provided by the patient. No language interpreter was used.    HPI Comments: Kendra Roberts is a 37 y.o. female who presents to the Emergency Department complaining of a right hand laceration that occurred around 10 AM today.Pt was playing with her son when she struck her hand on a glass object which had sharp edges. She reports that the glass did break on the floor after it had cut her hand and there were only large pieces of glass.  She did not remove any out of her hand.  She denies any numbness. Bleeding is controlled. Last tetanus unknown. Pt is an occasional alcohol user but denies smoking.  Past Medical History  Diagnosis Date  . Arthritis   . Asthma    History reviewed. No pertinent past surgical history. History reviewed. No pertinent family history. History  Substance Use Topics  . Smoking status: Never Smoker   . Smokeless tobacco: Not on file  . Alcohol Use: Yes   OB History   Grav Para Term Preterm Abortions TAB SAB Ect Mult Living                 Review of Systems  Constitutional: Negative for fever, diaphoresis, appetite change, fatigue and unexpected weight change.  HENT: Negative for mouth sores and neck stiffness.   Eyes: Negative for visual disturbance.  Respiratory: Negative for cough, chest tightness, shortness of breath and wheezing.   Cardiovascular: Negative for chest pain.  Gastrointestinal: Negative for nausea, vomiting, abdominal pain, diarrhea and constipation.  Endocrine: Negative for polydipsia, polyphagia and polyuria.  Genitourinary: Negative for dysuria, urgency, frequency and hematuria.   Musculoskeletal: Negative for back pain.  Skin: Positive for wound (Laceration). Negative for rash.  Allergic/Immunologic: Negative for immunocompromised state.  Neurological: Negative for syncope, light-headedness, numbness and headaches.  Hematological: Does not bruise/bleed easily.  Psychiatric/Behavioral: Negative for sleep disturbance. The patient is not nervous/anxious.     Allergies  Latex and Penicillins  Home Medications   Current Outpatient Rx  Name  Route  Sig  Dispense  Refill  . albuterol (PROVENTIL HFA;VENTOLIN HFA) 108 (90 BASE) MCG/ACT inhaler   Inhalation   Inhale 2 puffs into the lungs every 6 (six) hours as needed for wheezing.         Marland Kitchen albuterol (PROVENTIL) (2.5 MG/3ML) 0.083% nebulizer solution   Nebulization   Take 2.5 mg by nebulization every 6 (six) hours as needed for wheezing or shortness of breath.         . ciprofloxacin (CIPRO) 500 MG tablet   Oral   Take 500 mg by mouth 2 (two) times daily.         Marland Kitchen esomeprazole (NEXIUM) 40 MG capsule   Oral   Take 40 mg by mouth daily before breakfast.         . HYDROcodone-acetaminophen (NORCO/VICODIN) 5-325 MG per tablet   Oral   Take 1 tablet by mouth every 6 (six) hours as needed for pain.   15 tablet   0   . hydrOXYzine (ATARAX/VISTARIL) 50 MG tablet   Oral   Take 50 mg by mouth 3 (three)  times daily as needed for itching or anxiety.         . lamoTRIgine (LAMICTAL) 25 MG tablet   Oral   Take 25 mg by mouth 2 (two) times daily.         . montelukast (SINGULAIR) 10 MG tablet   Oral   Take 10 mg by mouth at bedtime.         . predniSONE (DELTASONE) 10 MG tablet   Oral   Take 1 tablet (10 mg total) by mouth daily.   21 tablet   0     Prednisone dose pack directions:   6 tabs on day ...   . QUEtiapine (SEROQUEL) 300 MG tablet   Oral   Take 300 mg by mouth at bedtime.          Triage Vitals: BP 119/58  Pulse 85  Temp(Src) 98.6 F (37 C) (Oral)  Resp 16  Wt 223 lb  (101.152 kg)  BMI 36.01 kg/m2  SpO2 95% Physical Exam  Nursing note and vitals reviewed. Constitutional: She is oriented to person, place, and time. She appears well-developed and well-nourished. No distress.  HENT:  Head: Normocephalic and atraumatic.  Eyes: Conjunctivae are normal. No scleral icterus.  Neck: Normal range of motion.  Cardiovascular: Normal rate, regular rhythm, normal heart sounds and intact distal pulses.   No murmur heard. Pulmonary/Chest: Effort normal and breath sounds normal. No respiratory distress. She has no wheezes.  Musculoskeletal: Normal range of motion. She exhibits tenderness (to the laceration site). She exhibits no edema.  Full range of motion of all fingers   Neurological: She is alert and oriented to person, place, and time. She exhibits normal muscle tone. Coordination normal.  5/5 strength with resisted flexion and extension of all fingers Sensation intact   Skin: Skin is warm and dry. No rash noted. She is not diaphoretic. No erythema.  Two lacerations to the medial aspect of the palm  - 3cm laceration on the distal palm and 4cm laceration on the proximal palm Cap refill less than three seconds   Psychiatric: She has a normal mood and affect. Her behavior is normal.    ED Course  Procedures (including critical care time)   DIAGNOSTIC STUDIES: Oxygen Saturation is 95% on RA, adequate by my interpretation.    COORDINATION OF CARE:  5:20 PM Laceration repair will be performed. Discussed treatment plan with pt at bedside and pt agreed to plan.   LACERATION REPAIR Performed by: Dahlia Client Icker Swigert Consent: Verbal consent obtained. Risks and benefits: risks, benefits and alternatives were discussed Patient identity confirmed: provided demographic data Time out performed prior to procedure Prepped and Draped in normal sterile fashion Wound explored Laceration Location: Medial aspect of palm, distal Laceration Length: 3 cm No Foreign  Bodies seen or palpated Anesthesia: local infiltration Local anesthetic: lidocaine 1% without epinephrine Anesthetic total: 5 ml Irrigation method: syringe Amount of cleaning: standard Skin closure: 6-0 proline Number of sutures or staples: 6 Technique: Simple, interrupted Patient tolerance: Patient tolerated the procedure well with no immediate complications.  LACERATION REPAIR Performed by: Dahlia Client Levetta Bognar Consent: Verbal consent obtained. Risks and benefits: risks, benefits and alternatives were discussed Patient identity confirmed: provided demographic data Time out performed prior to procedure Prepped and Draped in normal sterile fashion Wound explored Laceration Location: Medial aspect of the palm, proximal Laceration Length: 4cm No Foreign Bodies seen or palpated Anesthesia: local infiltration Local anesthetic: lidocaine 1% without epinephrine Anesthetic total: 5 ml Irrigation method: syringe Amount of  cleaning: standard Skin closure: 6-0 proline Number of sutures or staples: 5 Technique: Simple, interrupted Patient tolerance: Patient tolerated the procedure well with no immediate complications.   Labs Review Labs Reviewed - No data to display Imaging Review No results found.  MDM   1. Hand laceration, right, initial encounter    Margot A Rohrig presents after laceration.  Tdap booster given. Pressure irrigation performed.  Bottom of wound visualized and explored with no evidence of FB. Laceration occurred < 8 hours prior to repair which was well tolerated. Pt has no co morbidities to effect normal wound healing. Discussed suture home care w pt and answered questions. Pt to f-u for wound check and suture removal in 7 days. Pt is hemodynamically stable w no complaints prior to dc.  I have also discussed reasons to return immediately to the ER.  Patient expresses understanding and agrees with plan.  I personally performed the services described in this  documentation, which was scribed in my presence. The recorded information has been reviewed and is accurate.    Dahlia Client Cordai Rodrigue, PA-C 09/29/12 [phone]

## 2012-09-30 ENCOUNTER — Encounter: Payer: Medicare Other | Admitting: Physical Therapy

## 2012-10-02 NOTE — ED Provider Notes (Signed)
Medical screening examination/treatment/procedure(s) were performed by non-physician practitioner and as supervising physician I was immediately available for consultation/collaboration.  Candyce Churn, MD 10/02/12 [phone]

## 2012-10-07 ENCOUNTER — Emergency Department (INDEPENDENT_AMBULATORY_CARE_PROVIDER_SITE_OTHER)
Admission: EM | Admit: 2012-10-07 | Discharge: 2012-10-07 | Disposition: A | Discharge status: Home / Self Care | Payer: Medicare Other | Source: Home / Self Care | Attending: Emergency Medicine | Admitting: Emergency Medicine

## 2012-10-07 ENCOUNTER — Encounter (HOSPITAL_COMMUNITY): Payer: Self-pay | Admitting: Emergency Medicine

## 2012-10-07 DIAGNOSIS — IMO0002 Reserved for concepts with insufficient information to code with codable children: Secondary | ICD-10-CM

## 2012-10-07 DIAGNOSIS — Z4802 Encounter for removal of sutures: Secondary | ICD-10-CM

## 2012-10-07 NOTE — ED Provider Notes (Signed)
Chief Complaint:   Chief Complaint  Patient presents with  . Suture / Staple Removal    History of Present Illness:   Kendra Roberts is a 37 year old female who lacerated her right hand on a glass figure he 8 days ago. This was repaired in the emergency room. She returns today for suture removal. It's still somewhat painful. It's been draining a small amount of pus but there is no erythema. She's able to move all her digits well and denies any fever or chills.  Review of Systems:  Other than noted above, the patient denies any of the following symptoms: Systemic:  No fever or chills. Musculoskeletal:  No joint pain or decreased range of motion. Neuro:  No numbness, tingling, or weakness.  PMFSH:  Past medical history, family history, social history, meds, and allergies were reviewed.   Physical Exam:   Vital signs:  BP 121/73  Pulse 71  Temp(Src) 98.7 F (37.1 C) (Oral)  Resp 16  SpO2 100%  LMP 09/08/2012 Ext:  Looks like its healed up very well. There is no erythema, no purulent drainage. It is still somewhat tender to touch.  All other joints had a full ROM without pain.  Pulses were full.  Good capillary refill in all digits.  No edema. Neurological:  Alert and oriented.  No muscle weakness.  Sensation was intact to light touch.   Procedure: Verbal informed consent was obtained.  The patient was informed of the risks and benefits of the procedure and understands and accepts.  Identity of the patient was verified verbally and by wristband. The area was prepped with alcohol and sutures were removed. There was no separation of the wound edges. Antibiotic ointment and a sterile dressing was applied. Suggested she keep it covered and continue with antibiotic ointment for another 3 days.  Assessment:  The encounter diagnosis was Laceration.  No evidence of infection. Appears to be healing up well.  Plan:   1.  Meds:  The following meds were prescribed:   Discharge Medication List  as of 10/07/2012  3:40 PM      2.  Patient Education/Counseling:  The patient was given appropriate handouts, self care instructions, and instructed in symptomatic relief. Instructions were given for wound care.  Keep it covered and apply antibiotic ointment for 3 more days.  3.  Follow up:  The patient was told to follow up immediately if there is any sign of infection.   Reuben Likes, MD 10/07/12 2158

## 2012-10-07 NOTE — ED Notes (Signed)
C/o suture removal on right hand. Patient states she cut her hand while she was dancing with her kids.

## 2012-11-23 ENCOUNTER — Ambulatory Visit: Payer: Medicare Other | Admitting: Physical Therapy

## 2012-11-23 DIAGNOSIS — R7301 Impaired fasting glucose: Secondary | ICD-10-CM | POA: Diagnosis not present

## 2012-11-23 DIAGNOSIS — E559 Vitamin D deficiency, unspecified: Secondary | ICD-10-CM | POA: Diagnosis not present

## 2012-11-23 DIAGNOSIS — F411 Generalized anxiety disorder: Secondary | ICD-10-CM | POA: Diagnosis not present

## 2012-11-23 DIAGNOSIS — M545 Low back pain, unspecified: Secondary | ICD-10-CM | POA: Diagnosis not present

## 2012-11-23 DIAGNOSIS — Z23 Encounter for immunization: Secondary | ICD-10-CM | POA: Diagnosis not present

## 2012-11-23 DIAGNOSIS — J45909 Unspecified asthma, uncomplicated: Secondary | ICD-10-CM | POA: Diagnosis not present

## 2012-11-24 ENCOUNTER — Ambulatory Visit: Payer: Medicare Other | Attending: Orthopaedic Surgery | Admitting: Physical Therapy

## 2012-11-24 DIAGNOSIS — M6281 Muscle weakness (generalized): Secondary | ICD-10-CM | POA: Insufficient documentation

## 2012-11-24 DIAGNOSIS — IMO0001 Reserved for inherently not codable concepts without codable children: Secondary | ICD-10-CM | POA: Diagnosis not present

## 2012-11-24 DIAGNOSIS — M25569 Pain in unspecified knee: Secondary | ICD-10-CM | POA: Insufficient documentation

## 2012-11-24 DIAGNOSIS — M2550 Pain in unspecified joint: Secondary | ICD-10-CM | POA: Insufficient documentation

## 2012-11-24 DIAGNOSIS — R262 Difficulty in walking, not elsewhere classified: Secondary | ICD-10-CM | POA: Diagnosis not present

## 2012-11-30 ENCOUNTER — Ambulatory Visit: Payer: Medicare Other | Admitting: Physical Therapy

## 2012-12-02 ENCOUNTER — Ambulatory Visit: Payer: Medicare Other | Attending: Orthopaedic Surgery | Admitting: Physical Therapy

## 2012-12-02 DIAGNOSIS — IMO0001 Reserved for inherently not codable concepts without codable children: Secondary | ICD-10-CM | POA: Insufficient documentation

## 2012-12-02 DIAGNOSIS — M25569 Pain in unspecified knee: Secondary | ICD-10-CM | POA: Insufficient documentation

## 2012-12-02 DIAGNOSIS — M6281 Muscle weakness (generalized): Secondary | ICD-10-CM | POA: Insufficient documentation

## 2012-12-02 DIAGNOSIS — R262 Difficulty in walking, not elsewhere classified: Secondary | ICD-10-CM | POA: Insufficient documentation

## 2012-12-02 DIAGNOSIS — M2550 Pain in unspecified joint: Secondary | ICD-10-CM | POA: Insufficient documentation

## 2012-12-08 ENCOUNTER — Encounter: Payer: Medicare Other | Admitting: Rehabilitation

## 2012-12-09 ENCOUNTER — Ambulatory Visit: Payer: Medicare Other | Admitting: Rehabilitation

## 2012-12-14 ENCOUNTER — Ambulatory Visit: Payer: Medicare Other | Admitting: Rehabilitation

## 2013-01-28 DIAGNOSIS — R269 Unspecified abnormalities of gait and mobility: Secondary | ICD-10-CM | POA: Diagnosis not present

## 2013-01-28 DIAGNOSIS — M545 Low back pain, unspecified: Secondary | ICD-10-CM | POA: Diagnosis not present

## 2013-01-28 DIAGNOSIS — M5106 Intervertebral disc disorders with myelopathy, lumbar region: Secondary | ICD-10-CM | POA: Diagnosis not present

## 2013-02-07 DIAGNOSIS — M5106 Intervertebral disc disorders with myelopathy, lumbar region: Secondary | ICD-10-CM | POA: Diagnosis not present

## 2013-02-07 DIAGNOSIS — R269 Unspecified abnormalities of gait and mobility: Secondary | ICD-10-CM | POA: Diagnosis not present

## 2013-02-07 DIAGNOSIS — M545 Low back pain, unspecified: Secondary | ICD-10-CM | POA: Diagnosis not present

## 2013-02-10 DIAGNOSIS — R269 Unspecified abnormalities of gait and mobility: Secondary | ICD-10-CM | POA: Diagnosis not present

## 2013-02-10 DIAGNOSIS — M545 Low back pain, unspecified: Secondary | ICD-10-CM | POA: Diagnosis not present

## 2013-02-10 DIAGNOSIS — M5106 Intervertebral disc disorders with myelopathy, lumbar region: Secondary | ICD-10-CM | POA: Diagnosis not present

## 2013-02-11 DIAGNOSIS — M545 Low back pain, unspecified: Secondary | ICD-10-CM | POA: Diagnosis not present

## 2013-02-11 DIAGNOSIS — M5106 Intervertebral disc disorders with myelopathy, lumbar region: Secondary | ICD-10-CM | POA: Diagnosis not present

## 2013-02-11 DIAGNOSIS — R269 Unspecified abnormalities of gait and mobility: Secondary | ICD-10-CM | POA: Diagnosis not present

## 2013-02-17 DIAGNOSIS — R269 Unspecified abnormalities of gait and mobility: Secondary | ICD-10-CM | POA: Diagnosis not present

## 2013-02-17 DIAGNOSIS — M5106 Intervertebral disc disorders with myelopathy, lumbar region: Secondary | ICD-10-CM | POA: Diagnosis not present

## 2013-02-17 DIAGNOSIS — M545 Low back pain, unspecified: Secondary | ICD-10-CM | POA: Diagnosis not present

## 2013-02-18 DIAGNOSIS — R269 Unspecified abnormalities of gait and mobility: Secondary | ICD-10-CM | POA: Diagnosis not present

## 2013-02-18 DIAGNOSIS — M545 Low back pain, unspecified: Secondary | ICD-10-CM | POA: Diagnosis not present

## 2013-02-23 DIAGNOSIS — M545 Low back pain, unspecified: Secondary | ICD-10-CM | POA: Diagnosis not present

## 2013-02-23 DIAGNOSIS — R269 Unspecified abnormalities of gait and mobility: Secondary | ICD-10-CM | POA: Diagnosis not present

## 2013-02-24 DIAGNOSIS — M76899 Other specified enthesopathies of unspecified lower limb, excluding foot: Secondary | ICD-10-CM | POA: Diagnosis not present

## 2013-02-24 DIAGNOSIS — M25569 Pain in unspecified knee: Secondary | ICD-10-CM | POA: Diagnosis not present

## 2013-02-24 DIAGNOSIS — S93419A Sprain of calcaneofibular ligament of unspecified ankle, initial encounter: Secondary | ICD-10-CM | POA: Diagnosis not present

## 2013-02-25 DIAGNOSIS — M545 Low back pain, unspecified: Secondary | ICD-10-CM | POA: Diagnosis not present

## 2013-02-25 DIAGNOSIS — E559 Vitamin D deficiency, unspecified: Secondary | ICD-10-CM | POA: Diagnosis not present

## 2013-02-25 DIAGNOSIS — F17200 Nicotine dependence, unspecified, uncomplicated: Secondary | ICD-10-CM | POA: Diagnosis not present

## 2013-02-25 DIAGNOSIS — J45909 Unspecified asthma, uncomplicated: Secondary | ICD-10-CM | POA: Diagnosis not present

## 2013-02-25 DIAGNOSIS — R7301 Impaired fasting glucose: Secondary | ICD-10-CM | POA: Diagnosis not present

## 2013-02-25 DIAGNOSIS — R269 Unspecified abnormalities of gait and mobility: Secondary | ICD-10-CM | POA: Diagnosis not present

## 2013-02-25 DIAGNOSIS — K219 Gastro-esophageal reflux disease without esophagitis: Secondary | ICD-10-CM | POA: Diagnosis not present

## 2013-02-25 DIAGNOSIS — G63 Polyneuropathy in diseases classified elsewhere: Secondary | ICD-10-CM | POA: Diagnosis not present

## 2013-02-25 DIAGNOSIS — F411 Generalized anxiety disorder: Secondary | ICD-10-CM | POA: Diagnosis not present

## 2013-02-28 DIAGNOSIS — M545 Low back pain, unspecified: Secondary | ICD-10-CM | POA: Diagnosis not present

## 2013-02-28 DIAGNOSIS — R269 Unspecified abnormalities of gait and mobility: Secondary | ICD-10-CM | POA: Diagnosis not present

## 2013-03-02 DIAGNOSIS — R269 Unspecified abnormalities of gait and mobility: Secondary | ICD-10-CM | POA: Diagnosis not present

## 2013-03-02 DIAGNOSIS — M545 Low back pain, unspecified: Secondary | ICD-10-CM | POA: Diagnosis not present

## 2013-03-03 DIAGNOSIS — M19079 Primary osteoarthritis, unspecified ankle and foot: Secondary | ICD-10-CM | POA: Diagnosis not present

## 2013-03-03 DIAGNOSIS — M2020 Hallux rigidus, unspecified foot: Secondary | ICD-10-CM | POA: Diagnosis not present

## 2013-03-04 DIAGNOSIS — R269 Unspecified abnormalities of gait and mobility: Secondary | ICD-10-CM | POA: Diagnosis not present

## 2013-03-04 DIAGNOSIS — M545 Low back pain, unspecified: Secondary | ICD-10-CM | POA: Diagnosis not present

## 2013-03-07 DIAGNOSIS — M545 Low back pain, unspecified: Secondary | ICD-10-CM | POA: Diagnosis not present

## 2013-03-07 DIAGNOSIS — R269 Unspecified abnormalities of gait and mobility: Secondary | ICD-10-CM | POA: Diagnosis not present

## 2013-03-17 DIAGNOSIS — F3132 Bipolar disorder, current episode depressed, moderate: Secondary | ICD-10-CM | POA: Diagnosis not present

## 2013-03-17 DIAGNOSIS — F411 Generalized anxiety disorder: Secondary | ICD-10-CM | POA: Diagnosis not present

## 2013-04-12 DIAGNOSIS — M76899 Other specified enthesopathies of unspecified lower limb, excluding foot: Secondary | ICD-10-CM | POA: Diagnosis not present

## 2013-04-18 DIAGNOSIS — M25519 Pain in unspecified shoulder: Secondary | ICD-10-CM | POA: Diagnosis not present

## 2013-04-18 DIAGNOSIS — J45909 Unspecified asthma, uncomplicated: Secondary | ICD-10-CM | POA: Diagnosis not present

## 2013-04-18 DIAGNOSIS — F411 Generalized anxiety disorder: Secondary | ICD-10-CM | POA: Diagnosis not present

## 2013-04-18 DIAGNOSIS — M545 Low back pain, unspecified: Secondary | ICD-10-CM | POA: Diagnosis not present

## 2013-04-18 DIAGNOSIS — E559 Vitamin D deficiency, unspecified: Secondary | ICD-10-CM | POA: Diagnosis not present

## 2013-04-18 DIAGNOSIS — I69991 Dysphagia following unspecified cerebrovascular disease: Secondary | ICD-10-CM | POA: Diagnosis not present

## 2013-04-18 DIAGNOSIS — G63 Polyneuropathy in diseases classified elsewhere: Secondary | ICD-10-CM | POA: Diagnosis not present

## 2013-04-18 DIAGNOSIS — F17200 Nicotine dependence, unspecified, uncomplicated: Secondary | ICD-10-CM | POA: Diagnosis not present

## 2013-04-18 DIAGNOSIS — R7301 Impaired fasting glucose: Secondary | ICD-10-CM | POA: Diagnosis not present

## 2013-04-18 DIAGNOSIS — K219 Gastro-esophageal reflux disease without esophagitis: Secondary | ICD-10-CM | POA: Diagnosis not present

## 2013-04-21 DIAGNOSIS — F411 Generalized anxiety disorder: Secondary | ICD-10-CM | POA: Diagnosis not present

## 2013-07-22 DIAGNOSIS — I69991 Dysphagia following unspecified cerebrovascular disease: Secondary | ICD-10-CM | POA: Diagnosis not present

## 2013-07-22 DIAGNOSIS — R5383 Other fatigue: Secondary | ICD-10-CM | POA: Diagnosis not present

## 2013-07-22 DIAGNOSIS — R635 Abnormal weight gain: Secondary | ICD-10-CM | POA: Diagnosis not present

## 2013-07-22 DIAGNOSIS — R7301 Impaired fasting glucose: Secondary | ICD-10-CM | POA: Diagnosis not present

## 2013-07-22 DIAGNOSIS — M25519 Pain in unspecified shoulder: Secondary | ICD-10-CM | POA: Diagnosis not present

## 2013-07-22 DIAGNOSIS — R5381 Other malaise: Secondary | ICD-10-CM | POA: Diagnosis not present

## 2013-07-22 DIAGNOSIS — K219 Gastro-esophageal reflux disease without esophagitis: Secondary | ICD-10-CM | POA: Diagnosis not present

## 2013-07-22 DIAGNOSIS — M2550 Pain in unspecified joint: Secondary | ICD-10-CM | POA: Diagnosis not present

## 2013-07-22 DIAGNOSIS — L708 Other acne: Secondary | ICD-10-CM | POA: Diagnosis not present

## 2013-07-22 DIAGNOSIS — G63 Polyneuropathy in diseases classified elsewhere: Secondary | ICD-10-CM | POA: Diagnosis not present

## 2013-07-22 DIAGNOSIS — M545 Low back pain, unspecified: Secondary | ICD-10-CM | POA: Diagnosis not present

## 2013-07-22 DIAGNOSIS — E559 Vitamin D deficiency, unspecified: Secondary | ICD-10-CM | POA: Diagnosis not present

## 2013-07-22 DIAGNOSIS — J45909 Unspecified asthma, uncomplicated: Secondary | ICD-10-CM | POA: Diagnosis not present

## 2013-07-22 DIAGNOSIS — F17200 Nicotine dependence, unspecified, uncomplicated: Secondary | ICD-10-CM | POA: Diagnosis not present

## 2013-09-15 DIAGNOSIS — F17200 Nicotine dependence, unspecified, uncomplicated: Secondary | ICD-10-CM | POA: Diagnosis not present

## 2013-09-15 DIAGNOSIS — J3089 Other allergic rhinitis: Secondary | ICD-10-CM | POA: Diagnosis not present

## 2013-09-15 DIAGNOSIS — E559 Vitamin D deficiency, unspecified: Secondary | ICD-10-CM | POA: Diagnosis not present

## 2013-09-15 DIAGNOSIS — K219 Gastro-esophageal reflux disease without esophagitis: Secondary | ICD-10-CM | POA: Diagnosis not present

## 2013-09-15 DIAGNOSIS — R7301 Impaired fasting glucose: Secondary | ICD-10-CM | POA: Diagnosis not present

## 2013-09-15 DIAGNOSIS — G63 Polyneuropathy in diseases classified elsewhere: Secondary | ICD-10-CM | POA: Diagnosis not present

## 2013-09-15 DIAGNOSIS — J45909 Unspecified asthma, uncomplicated: Secondary | ICD-10-CM | POA: Diagnosis not present

## 2013-10-14 ENCOUNTER — Other Ambulatory Visit: Payer: Self-pay | Admitting: Gastroenterology

## 2013-10-14 DIAGNOSIS — K219 Gastro-esophageal reflux disease without esophagitis: Secondary | ICD-10-CM | POA: Diagnosis not present

## 2013-10-14 DIAGNOSIS — R1314 Dysphagia, pharyngoesophageal phase: Secondary | ICD-10-CM | POA: Diagnosis not present

## 2013-10-14 DIAGNOSIS — R1310 Dysphagia, unspecified: Secondary | ICD-10-CM

## 2013-10-18 ENCOUNTER — Ambulatory Visit
Admission: RE | Admit: 2013-10-18 | Discharge: 2013-10-18 | Disposition: A | Discharge status: Home / Self Care | Payer: Medicare Other | Source: Ambulatory Visit | Attending: Gastroenterology | Admitting: Gastroenterology

## 2013-10-18 DIAGNOSIS — K219 Gastro-esophageal reflux disease without esophagitis: Secondary | ICD-10-CM | POA: Diagnosis not present

## 2013-10-18 DIAGNOSIS — K449 Diaphragmatic hernia without obstruction or gangrene: Secondary | ICD-10-CM | POA: Diagnosis not present

## 2013-10-18 DIAGNOSIS — R1310 Dysphagia, unspecified: Secondary | ICD-10-CM

## 2013-11-03 ENCOUNTER — Other Ambulatory Visit: Payer: Self-pay | Admitting: Obstetrics & Gynecology

## 2013-11-03 ENCOUNTER — Other Ambulatory Visit (HOSPITAL_COMMUNITY)
Admission: RE | Admit: 2013-11-03 | Discharge: 2013-11-03 | Disposition: A | Discharge status: Home / Self Care | Payer: Medicare Other | Source: Ambulatory Visit | Attending: Obstetrics & Gynecology | Admitting: Obstetrics & Gynecology

## 2013-11-03 DIAGNOSIS — Z1151 Encounter for screening for human papillomavirus (HPV): Secondary | ICD-10-CM | POA: Insufficient documentation

## 2013-11-03 DIAGNOSIS — N898 Other specified noninflammatory disorders of vagina: Secondary | ICD-10-CM | POA: Diagnosis not present

## 2013-11-03 DIAGNOSIS — Z01411 Encounter for gynecological examination (general) (routine) with abnormal findings: Secondary | ICD-10-CM | POA: Diagnosis not present

## 2013-11-03 DIAGNOSIS — Z9851 Tubal ligation status: Secondary | ICD-10-CM | POA: Diagnosis not present

## 2013-11-03 DIAGNOSIS — N920 Excessive and frequent menstruation with regular cycle: Secondary | ICD-10-CM | POA: Diagnosis not present

## 2013-11-03 DIAGNOSIS — Z124 Encounter for screening for malignant neoplasm of cervix: Secondary | ICD-10-CM | POA: Diagnosis not present

## 2013-11-03 DIAGNOSIS — Z113 Encounter for screening for infections with a predominantly sexual mode of transmission: Secondary | ICD-10-CM | POA: Diagnosis present

## 2013-11-03 DIAGNOSIS — N644 Mastodynia: Secondary | ICD-10-CM | POA: Diagnosis not present

## 2013-11-03 DIAGNOSIS — N7689 Other specified inflammation of vagina and vulva: Secondary | ICD-10-CM | POA: Diagnosis not present

## 2013-11-04 LAB — CYTOLOGY - PAP

## 2013-11-22 DIAGNOSIS — N920 Excessive and frequent menstruation with regular cycle: Secondary | ICD-10-CM | POA: Diagnosis not present

## 2013-11-22 DIAGNOSIS — Z8619 Personal history of other infectious and parasitic diseases: Secondary | ICD-10-CM | POA: Diagnosis not present

## 2013-11-25 ENCOUNTER — Other Ambulatory Visit (HOSPITAL_COMMUNITY): Payer: Self-pay | Admitting: Gastroenterology

## 2013-11-25 DIAGNOSIS — R112 Nausea with vomiting, unspecified: Secondary | ICD-10-CM | POA: Diagnosis not present

## 2013-11-25 DIAGNOSIS — R1314 Dysphagia, pharyngoesophageal phase: Secondary | ICD-10-CM | POA: Diagnosis not present

## 2013-11-29 DIAGNOSIS — J309 Allergic rhinitis, unspecified: Secondary | ICD-10-CM | POA: Diagnosis not present

## 2013-11-29 DIAGNOSIS — K219 Gastro-esophageal reflux disease without esophagitis: Secondary | ICD-10-CM | POA: Diagnosis not present

## 2013-11-29 DIAGNOSIS — E785 Hyperlipidemia, unspecified: Secondary | ICD-10-CM | POA: Diagnosis not present

## 2013-11-29 DIAGNOSIS — R7309 Other abnormal glucose: Secondary | ICD-10-CM | POA: Diagnosis not present

## 2013-11-29 DIAGNOSIS — J45909 Unspecified asthma, uncomplicated: Secondary | ICD-10-CM | POA: Diagnosis not present

## 2013-11-29 DIAGNOSIS — E559 Vitamin D deficiency, unspecified: Secondary | ICD-10-CM | POA: Diagnosis not present

## 2013-11-29 DIAGNOSIS — L309 Dermatitis, unspecified: Secondary | ICD-10-CM | POA: Diagnosis not present

## 2013-11-29 DIAGNOSIS — Z720 Tobacco use: Secondary | ICD-10-CM | POA: Diagnosis not present

## 2013-12-13 ENCOUNTER — Ambulatory Visit (HOSPITAL_COMMUNITY): Admission: RE | Admit: 2013-12-13 | Payer: Medicare Other | Source: Ambulatory Visit

## 2014-01-09 ENCOUNTER — Ambulatory Visit (HOSPITAL_COMMUNITY): Payer: Medicare Other

## 2014-06-29 DIAGNOSIS — F3132 Bipolar disorder, current episode depressed, moderate: Secondary | ICD-10-CM | POA: Diagnosis not present

## 2014-06-29 DIAGNOSIS — F419 Anxiety disorder, unspecified: Secondary | ICD-10-CM | POA: Diagnosis not present

## 2014-07-04 ENCOUNTER — Inpatient Hospital Stay (HOSPITAL_COMMUNITY)
Admission: AD | Admit: 2014-07-04 | Discharge: 2014-07-04 | Disposition: A | Discharge status: Home / Self Care | Payer: Medicare Other | Source: Ambulatory Visit | Attending: Obstetrics & Gynecology | Admitting: Obstetrics & Gynecology

## 2014-07-04 ENCOUNTER — Encounter (HOSPITAL_COMMUNITY): Payer: Self-pay | Admitting: *Deleted

## 2014-07-04 ENCOUNTER — Inpatient Hospital Stay (HOSPITAL_COMMUNITY): Payer: Medicare Other

## 2014-07-04 DIAGNOSIS — A5901 Trichomonal vulvovaginitis: Secondary | ICD-10-CM | POA: Insufficient documentation

## 2014-07-04 DIAGNOSIS — R1032 Left lower quadrant pain: Secondary | ICD-10-CM | POA: Diagnosis not present

## 2014-07-04 DIAGNOSIS — Z6834 Body mass index (BMI) 34.0-34.9, adult: Secondary | ICD-10-CM | POA: Diagnosis not present

## 2014-07-04 DIAGNOSIS — Z880 Allergy status to penicillin: Secondary | ICD-10-CM | POA: Diagnosis not present

## 2014-07-04 DIAGNOSIS — R109 Unspecified abdominal pain: Secondary | ICD-10-CM

## 2014-07-04 DIAGNOSIS — Z9851 Tubal ligation status: Secondary | ICD-10-CM | POA: Diagnosis not present

## 2014-07-04 DIAGNOSIS — D259 Leiomyoma of uterus, unspecified: Secondary | ICD-10-CM

## 2014-07-04 DIAGNOSIS — F17210 Nicotine dependence, cigarettes, uncomplicated: Secondary | ICD-10-CM | POA: Insufficient documentation

## 2014-07-04 DIAGNOSIS — R102 Pelvic and perineal pain: Secondary | ICD-10-CM | POA: Diagnosis not present

## 2014-07-04 DIAGNOSIS — N926 Irregular menstruation, unspecified: Secondary | ICD-10-CM | POA: Diagnosis not present

## 2014-07-04 HISTORY — DX: Calculus of kidney: N20.0

## 2014-07-04 HISTORY — DX: Unspecified convulsions: R56.9

## 2014-07-04 LAB — URINALYSIS, ROUTINE W REFLEX MICROSCOPIC
BILIRUBIN URINE: NEGATIVE
Glucose, UA: NEGATIVE mg/dL
HGB URINE DIPSTICK: NEGATIVE
KETONES UR: NEGATIVE mg/dL
Nitrite: NEGATIVE
Protein, ur: NEGATIVE mg/dL
Specific Gravity, Urine: 1.01 (ref 1.005–1.030)
UROBILINOGEN UA: 0.2 mg/dL (ref 0.0–1.0)
pH: 6 (ref 5.0–8.0)

## 2014-07-04 LAB — URINE MICROSCOPIC-ADD ON

## 2014-07-04 LAB — WET PREP, GENITAL
Clue Cells Wet Prep HPF POC: NONE SEEN
Yeast Wet Prep HPF POC: NONE SEEN

## 2014-07-04 MED ORDER — METRONIDAZOLE 500 MG PO TABS
2000.0000 mg | ORAL_TABLET | Freq: Once | ORAL | Status: AC
  Administered 2014-07-04: 2000 mg via ORAL
  Filled 2014-07-04: qty 4

## 2014-07-04 MED ORDER — KETOROLAC TROMETHAMINE 60 MG/2ML IM SOLN
60.0000 mg | Freq: Once | INTRAMUSCULAR | Status: AC
Start: 2014-07-04 — End: 2014-07-04
  Administered 2014-07-04: 60 mg via INTRAMUSCULAR
  Filled 2014-07-04: qty 2

## 2014-07-04 MED ORDER — CEFTRIAXONE SODIUM 250 MG IJ SOLR
250.0000 mg | Freq: Once | INTRAMUSCULAR | Status: AC
  Administered 2014-07-04: 250 mg via INTRAMUSCULAR
  Filled 2014-07-04: qty 250

## 2014-07-04 NOTE — MAU Note (Signed)
Fri, period started, Sat went off- prior to stopping, passed 3 large clots, after ward had horrible pain in LLQ.  Is still having pain in LLQ.  Has had tubal.  MD called prior to arrival; blood test was neg.

## 2014-07-04 NOTE — MAU Provider Note (Signed)
History     CSN: [id number]  Arrival date and time: 07/04/14 1533   None     Chief Complaint  Patient presents with  . Abdominal Pain   HPI Pt is not pregnant and presents with LLQ pain- onset Friday- pt has had nausea associated with the pain Pt has had period Friday and Sat- with 3 large clots- no bleeding now Pt was seen in office earlier today and evaluated by Dr. Nelda Marseille.  Pt sent to be evaluated and [place] RN note: Pt states she has hx of trichomonas ago 4 months ago and had neg TOC at the office per Dr. Pearson Grippe, period started, Sat went off- prior to stopping, passed 3 large clots, after ward had horrible pain in LLQ. Is still having pain in LLQ. Has had tubal. MD called prior to arrival; blood test was neg Past Medical History  Diagnosis Date  . Arthritis   . Asthma   . Seizures     One seizure years ago  . Kidney stones     Past Surgical History  Procedure Laterality Date  . Tubal ligation    . Kidney stone surgery      History reviewed. No pertinent family history.  History  Substance Use Topics  . Smoking status: Current Some Day Smoker  . Smokeless tobacco: Not on file  . Alcohol Use: No    Allergies:  Allergies  Allergen Reactions  . Latex Anaphylaxis and Hives  . Penicillins Anaphylaxis    Prescriptions prior to admission  Medication Sig Dispense Refill Last Dose  . albuterol (PROVENTIL HFA;VENTOLIN HFA) 108 (90 BASE) MCG/ACT inhaler Inhale 2 puffs into the lungs every 6 (six) hours as needed for wheezing.   09/29/2012 at Unknown  . albuterol (PROVENTIL) (2.5 MG/3ML) 0.083% nebulizer solution Take 2.5 mg by nebulization every 6 (six) hours as needed for wheezing or shortness of breath.   09/29/2012 at Unknown  . ciprofloxacin (CIPRO) 500 MG tablet Take 500 mg by mouth 2 (two) times daily.   09/28/2012 at Unknown  . esomeprazole (NEXIUM) 40 MG capsule Take 40 mg by mouth daily before breakfast.   09/29/2012 at Unknown  . HYDROcodone-acetaminophen  (NORCO/VICODIN) 5-325 MG per tablet Take 1 tablet by mouth every 6 (six) hours as needed for pain. 15 tablet 0 09/29/2012 at Unknown  . hydrOXYzine (ATARAX/VISTARIL) 50 MG tablet Take 50 mg by mouth 3 (three) times daily as needed for itching or anxiety.   09/28/2012 at Unknown  . lamoTRIgine (LAMICTAL) 25 MG tablet Take 25 mg by mouth 2 (two) times daily.   09/29/2012 at Unknown  . montelukast (SINGULAIR) 10 MG tablet Take 10 mg by mouth at bedtime.   09/28/2012 at Unknown  . predniSONE (DELTASONE) 10 MG tablet Take 1 tablet (10 mg total) by mouth daily. 21 tablet 0 09/28/2012 at Unknown  . QUEtiapine (SEROQUEL) 300 MG tablet Take 300 mg by mouth at bedtime.   09/28/2012 at Unknown    Review of Systems  Constitutional: Negative for fever and chills.  Gastrointestinal: Positive for nausea and abdominal pain. Negative for vomiting, diarrhea and constipation.  Genitourinary: Negative for dysuria and urgency.  Neurological: Negative for dizziness and headaches.   Physical Exam   Blood pressure 126/82, pulse 76, temperature 98.4 F (36.9 C), temperature source Oral, resp. rate 18, height 5' 1.5" (1.562 m), weight 231 lb (104.781 kg), last menstrual period 06/30/2014.  Physical Exam  Nursing note and vitals reviewed. Constitutional: She is oriented to person, place,  and time. She appears well-developed and well-nourished. No distress.  HENT:  Head: Normocephalic.  Eyes: Pupils are equal, round, and reactive to light.  Neck: Normal range of motion. Neck supple.  Cardiovascular: Normal rate.   Respiratory: Effort normal.  GI: Soft. She exhibits no distension. There is tenderness. There is guarding. There is no rebound.  Genitourinary:  Mod amount of white watery discharge in vault Bimanual- cervix NT; uterus NT- left adnexa extremely tender with palpation- right adnexa without palpable enlargement or tenderness- no rebound  Musculoskeletal: Normal range of motion.  Neurological: She is alert and  oriented to person, place, and time.  Skin: Skin is warm and dry.  Psychiatric: She has a normal mood and affect.    MAU Course  Procedures toradol 65m IM given Results for orders placed or performed during the hospital encounter of 07/04/14 (from the past 24 hour(s))  [facility]inal[facility], [facility]     Status: Abnormal   Collection Time: 07/04/14  3:30 PM  Result Value Ref Range   Color, Urine YELLOW YELLOW   APPearance CLEAR CLEAR   Specific Gravity, Urine 1.010 1.005 - 1.030   pH 6.0 5.0 - 8.0   Glucose, UA NEGATIVE NEGATIVE mg/dL   Hgb urine dipstick NEGATIVE NEGATIVE   Bilirubin Urine NEGATIVE NEGATIVE   Ketones, ur NEGATIVE NEGATIVE mg/dL   Protein, ur NEGATIVE NEGATIVE mg/dL   Urobilinogen, UA 0.2 0.0 - 1.0 mg/dL   Nitrite NEGATIVE NEGATIVE   Leukocytes, UA MODERATE (A) NEGATIVE  Urine microscopic-add on     Status: Abnormal   Collection Time: 07/04/14  3:30 PM  Result Value Ref Range   Squamous Epithelial / LPF MANY (A) RARE   WBC, UA 7-10 <3 WBC/hpf   RBC / HPF 0-2 <3 RBC/hpf   Bacteria, UA FEW (A) RARE   Urine-Other MUCOUS PRESENT                  Ref Range 3:30 PM  39yrgo     Squamous Epithelial / LPF RARE  MANY (A) RARE    WBC, UA <3 WBC/hpf 7-10 TOO NUMEROUS TO COUNT    RBC / HPF <3 RBC/hpf 0-2 0-2    Bacteria, UA RARE  FEW (A) MANY (A)    Urine-Other  MUCOUS PRESENT MUCOUS PRESENT   Comments: TRICHOMONAS PRESENT   Resulting Agency  [facility]         Results for orders placed or performed during the hospital encounter of 07/04/14 (from the past 24 hour(s))  [facility], [facility]    Status: Abnormal   Collection Time: 07/04/14  3:30 PM  Result Value Ref Range   Color, Urine YELLOW YELLOW   APPearance CLEAR CLEAR   Specific Gravity, Urine 1.010 1.005 - 1.030   pH 6.0 5.0 - 8.0   Glucose, UA NEGATIVE NEGATIVE mg/dL   Hgb urine dipstick NEGATIVE NEGATIVE   Bilirubin Urine NEGATIVE  NEGATIVE   Ketones, ur NEGATIVE NEGATIVE mg/dL   Protein, ur NEGATIVE NEGATIVE mg/dL   Urobilinogen, UA 0.2 0.0 - 1.0 mg/dL   Nitrite NEGATIVE NEGATIVE   Leukocytes, UA MODERATE (A) NEGATIVE  Urine microscopic-add on     Status: Abnormal   Collection Time: 07/04/14  3:30 PM  Result Value Ref Range   Squamous Epithelial / LPF MANY (A) RARE   WBC, UA 7-10 <3 WBC/hpf   RBC / HPF 0-2 <3 RBC/hpf   Bacteria, UA FEW (A)  RARE   Urine-Other MUCOUS PRESENT   Wet prep, genital     Status: Abnormal   Collection Time: 07/04/14  8:00 PM  Result Value Ref Range   Yeast Wet Prep HPF POC NONE SEEN NONE SEEN   Trich, Wet Prep FEW (A) NONE SEEN   Clue Cells Wet Prep HPF POC NONE SEEN NONE SEEN   WBC, Wet Prep HPF POC FEW (A) NONE SEEN  US Transvaginal Non-ob  07/04/2014   CLINICAL DATA:  Left lower quadrant pain.  EXAM: TRANSABDOMINAL AND TRANSVAGINAL ULTRASOUND OF PELVIS  TECHNIQUE: Both transabdominal and transvaginal ultrasound examinations of the pelvis were performed. Transabdominal technique was performed for global imaging of the pelvis including uterus, ovaries, adnexal regions, and pelvic cul-de-sac. It was necessary to proceed with endovaginal exam following the transabdominal exam to visualize the uterus, endometrium, ovaries and adnexa .  COMPARISON:  01/16/2010  FINDINGS: Uterus  Measurements: 8.6 x 4.5 x 5.1 cm. Three fibroids noted, the largest 1.6 cm in the left fundus. Right fundal 1.5 cm and anterior fundal 1.2 cm fibroids also noted.  Endometrium  Thickness: 5 mm in thickness.  No focal abnormality visualized.  Right ovary  Measurements: 3.0 x 1.3 x 2.2 cm. Normal appearance/no adnexal mass.  Left ovary  Measurements: 2.2 x 1.2 x 1.8 cm. Normal appearance/no adnexal mass.  Other findings  No free fluid.  IMPRESSION: Small fibroids within the uterus.  No acute findings.   Electronically Signed   By: Rolm Baptise M.D.   On: 07/04/2014 20:51   US Pelvis Complete  07/04/2014   CLINICAL DATA:  Left  lower quadrant pain.  EXAM: TRANSABDOMINAL AND TRANSVAGINAL ULTRASOUND OF PELVIS  TECHNIQUE: Both transabdominal and transvaginal ultrasound examinations of the pelvis were performed. Transabdominal technique was performed for global imaging of the pelvis including uterus, ovaries, adnexal regions, and pelvic cul-de-sac. It was necessary to proceed with endovaginal exam following the transabdominal exam to visualize the uterus, endometrium, ovaries and adnexa .  COMPARISON:  01/16/2010  FINDINGS: Uterus  Measurements: 8.6 x 4.5 x 5.1 cm. Three fibroids noted, the largest 1.6 cm in the left fundus. Right fundal 1.5 cm and anterior fundal 1.2 cm fibroids also noted.  Endometrium  Thickness: 5 mm in thickness.  No focal abnormality visualized.  Right ovary  Measurements: 3.0 x 1.3 x 2.2 cm. Normal appearance/no adnexal mass.  Left ovary  Measurements: 2.2 x 1.2 x 1.8 cm. Normal appearance/no adnexal mass.  Other findings  No free fluid.  IMPRESSION: Small fibroids within the uterus.  No acute findings.   Electronically Signed   By: Rolm Baptise M.D.   On: 07/04/2014 20:51  discussed with Dr. Nelda Marseille Will give rocephin 227m and flagyl 2 gm  Pt has allergic reaction to Penicillin but pt states that she can take amoxiliin Discussed with pharmacist- reference states Rocephin is negligible cross reactivity with Penicillin GC/chlamdyia pending  Assessment and Plan  Abdominal pain- possible PID-treated in MAU with Rocephin 2547mTrichomonas-treated in MAU with Flagyl 2 grams Will have pt take benadryl or Vistaril when pt gets home F/u with Dr. ozCheryle Horsfall/07/2014, 7:21 PM

## 2014-07-05 LAB — GC/CHLAMYDIA PROBE AMP (~~LOC~~) NOT AT ARMC
Chlamydia: NEGATIVE
Neisseria Gonorrhea: NEGATIVE

## 2014-07-18 DIAGNOSIS — J309 Allergic rhinitis, unspecified: Secondary | ICD-10-CM | POA: Diagnosis not present

## 2014-07-18 DIAGNOSIS — E785 Hyperlipidemia, unspecified: Secondary | ICD-10-CM | POA: Diagnosis not present

## 2014-07-18 DIAGNOSIS — Z0000 Encounter for general adult medical examination without abnormal findings: Secondary | ICD-10-CM | POA: Diagnosis not present

## 2014-07-18 DIAGNOSIS — J45909 Unspecified asthma, uncomplicated: Secondary | ICD-10-CM | POA: Diagnosis not present

## 2014-07-18 DIAGNOSIS — Z0100 Encounter for examination of eyes and vision without abnormal findings: Secondary | ICD-10-CM | POA: Diagnosis not present

## 2014-07-18 DIAGNOSIS — R7309 Other abnormal glucose: Secondary | ICD-10-CM | POA: Diagnosis not present

## 2014-07-18 DIAGNOSIS — K219 Gastro-esophageal reflux disease without esophagitis: Secondary | ICD-10-CM | POA: Diagnosis not present

## 2014-07-18 DIAGNOSIS — Z720 Tobacco use: Secondary | ICD-10-CM | POA: Diagnosis not present

## 2014-07-18 DIAGNOSIS — Z1389 Encounter for screening for other disorder: Secondary | ICD-10-CM | POA: Diagnosis not present

## 2014-07-18 DIAGNOSIS — Z01118 Encounter for examination of ears and hearing with other abnormal findings: Secondary | ICD-10-CM | POA: Diagnosis not present

## 2014-07-18 DIAGNOSIS — G479 Sleep disorder, unspecified: Secondary | ICD-10-CM | POA: Diagnosis not present

## 2014-07-18 DIAGNOSIS — M545 Low back pain: Secondary | ICD-10-CM | POA: Diagnosis not present

## 2014-07-18 DIAGNOSIS — Z9189 Other specified personal risk factors, not elsewhere classified: Secondary | ICD-10-CM | POA: Diagnosis not present

## 2014-07-18 DIAGNOSIS — E559 Vitamin D deficiency, unspecified: Secondary | ICD-10-CM | POA: Diagnosis not present

## 2014-07-25 DIAGNOSIS — G479 Sleep disorder, unspecified: Secondary | ICD-10-CM | POA: Diagnosis not present

## 2014-07-25 DIAGNOSIS — Z9189 Other specified personal risk factors, not elsewhere classified: Secondary | ICD-10-CM | POA: Diagnosis not present

## 2014-07-25 DIAGNOSIS — G4733 Obstructive sleep apnea (adult) (pediatric): Secondary | ICD-10-CM | POA: Diagnosis not present

## 2014-08-08 DIAGNOSIS — J45909 Unspecified asthma, uncomplicated: Secondary | ICD-10-CM | POA: Diagnosis not present

## 2014-08-08 DIAGNOSIS — E559 Vitamin D deficiency, unspecified: Secondary | ICD-10-CM | POA: Diagnosis not present

## 2014-08-08 DIAGNOSIS — T148 Other injury of unspecified body region: Secondary | ICD-10-CM | POA: Diagnosis not present

## 2014-08-08 DIAGNOSIS — J309 Allergic rhinitis, unspecified: Secondary | ICD-10-CM | POA: Diagnosis not present

## 2014-08-08 DIAGNOSIS — R7309 Other abnormal glucose: Secondary | ICD-10-CM | POA: Diagnosis not present

## 2014-08-08 DIAGNOSIS — K219 Gastro-esophageal reflux disease without esophagitis: Secondary | ICD-10-CM | POA: Diagnosis not present

## 2014-08-08 DIAGNOSIS — Z720 Tobacco use: Secondary | ICD-10-CM | POA: Diagnosis not present

## 2014-08-08 DIAGNOSIS — E785 Hyperlipidemia, unspecified: Secondary | ICD-10-CM | POA: Diagnosis not present

## 2014-08-24 DIAGNOSIS — J209 Acute bronchitis, unspecified: Secondary | ICD-10-CM | POA: Diagnosis not present

## 2014-09-05 DIAGNOSIS — H40023 Open angle with borderline findings, high risk, bilateral: Secondary | ICD-10-CM | POA: Diagnosis not present

## 2014-09-05 DIAGNOSIS — H53143 Visual discomfort, bilateral: Secondary | ICD-10-CM | POA: Diagnosis not present

## 2014-10-05 ENCOUNTER — Emergency Department (HOSPITAL_COMMUNITY): Payer: No Typology Code available for payment source

## 2014-10-05 ENCOUNTER — Encounter (HOSPITAL_COMMUNITY): Payer: Self-pay

## 2014-10-05 ENCOUNTER — Emergency Department (HOSPITAL_COMMUNITY)
Admission: EM | Admit: 2014-10-05 | Discharge: 2014-10-05 | Disposition: A | Discharge status: Home / Self Care | Payer: No Typology Code available for payment source | Attending: Emergency Medicine | Admitting: Emergency Medicine

## 2014-10-05 DIAGNOSIS — Y92410 Unspecified street and highway as the place of occurrence of the external cause: Secondary | ICD-10-CM | POA: Diagnosis not present

## 2014-10-05 DIAGNOSIS — Z720 Tobacco use: Secondary | ICD-10-CM | POA: Insufficient documentation

## 2014-10-05 DIAGNOSIS — S199XXA Unspecified injury of neck, initial encounter: Secondary | ICD-10-CM | POA: Diagnosis present

## 2014-10-05 DIAGNOSIS — Z87442 Personal history of urinary calculi: Secondary | ICD-10-CM | POA: Insufficient documentation

## 2014-10-05 DIAGNOSIS — Z880 Allergy status to penicillin: Secondary | ICD-10-CM | POA: Insufficient documentation

## 2014-10-05 DIAGNOSIS — R7309 Other abnormal glucose: Secondary | ICD-10-CM | POA: Diagnosis not present

## 2014-10-05 DIAGNOSIS — E559 Vitamin D deficiency, unspecified: Secondary | ICD-10-CM | POA: Diagnosis not present

## 2014-10-05 DIAGNOSIS — J309 Allergic rhinitis, unspecified: Secondary | ICD-10-CM | POA: Diagnosis not present

## 2014-10-05 DIAGNOSIS — G40909 Epilepsy, unspecified, not intractable, without status epilepticus: Secondary | ICD-10-CM | POA: Diagnosis not present

## 2014-10-05 DIAGNOSIS — K219 Gastro-esophageal reflux disease without esophagitis: Secondary | ICD-10-CM | POA: Diagnosis not present

## 2014-10-05 DIAGNOSIS — S161XXA Strain of muscle, fascia and tendon at neck level, initial encounter: Secondary | ICD-10-CM | POA: Diagnosis not present

## 2014-10-05 DIAGNOSIS — M4322 Fusion of spine, cervical region: Secondary | ICD-10-CM | POA: Diagnosis not present

## 2014-10-05 DIAGNOSIS — S3992XA Unspecified injury of lower back, initial encounter: Secondary | ICD-10-CM | POA: Insufficient documentation

## 2014-10-05 DIAGNOSIS — Y9389 Activity, other specified: Secondary | ICD-10-CM | POA: Insufficient documentation

## 2014-10-05 DIAGNOSIS — Z91040 Latex allergy status: Secondary | ICD-10-CM | POA: Diagnosis not present

## 2014-10-05 DIAGNOSIS — M1990 Unspecified osteoarthritis, unspecified site: Secondary | ICD-10-CM | POA: Insufficient documentation

## 2014-10-05 DIAGNOSIS — Y998 Other external cause status: Secondary | ICD-10-CM | POA: Insufficient documentation

## 2014-10-05 DIAGNOSIS — S4992XA Unspecified injury of left shoulder and upper arm, initial encounter: Secondary | ICD-10-CM | POA: Diagnosis not present

## 2014-10-05 DIAGNOSIS — J45909 Unspecified asthma, uncomplicated: Secondary | ICD-10-CM | POA: Diagnosis not present

## 2014-10-05 DIAGNOSIS — E785 Hyperlipidemia, unspecified: Secondary | ICD-10-CM | POA: Diagnosis not present

## 2014-10-05 DIAGNOSIS — Z79899 Other long term (current) drug therapy: Secondary | ICD-10-CM | POA: Diagnosis not present

## 2014-10-05 MED ORDER — HYDROCODONE-ACETAMINOPHEN 5-325 MG PO TABS: 1.0000 | ORAL_TABLET | ORAL | Status: DC | PRN

## 2014-10-05 MED ORDER — NAPROXEN 500 MG PO TABS: 500.0000 mg | ORAL_TABLET | Freq: Two times a day (BID) | ORAL | Status: DC

## 2014-10-05 MED ORDER — CYCLOBENZAPRINE HCL 10 MG PO TABS: 10.0000 mg | ORAL_TABLET | Freq: Two times a day (BID) | ORAL | Status: DC | PRN
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
## 2014-10-05 NOTE — Discharge Instructions (Signed)
Do not take the muscle relaxant or narcotic pain medication if driving because they will make you sleepy.

## 2014-11-16 DIAGNOSIS — N760 Acute vaginitis: Secondary | ICD-10-CM | POA: Diagnosis not present

## 2014-11-16 DIAGNOSIS — R102 Pelvic and perineal pain: Secondary | ICD-10-CM | POA: Diagnosis not present

## 2014-12-08 DIAGNOSIS — R7303 Prediabetes: Secondary | ICD-10-CM | POA: Diagnosis not present

## 2014-12-08 DIAGNOSIS — E559 Vitamin D deficiency, unspecified: Secondary | ICD-10-CM | POA: Diagnosis not present

## 2014-12-08 DIAGNOSIS — R7301 Impaired fasting glucose: Secondary | ICD-10-CM | POA: Diagnosis not present

## 2014-12-08 DIAGNOSIS — K219 Gastro-esophageal reflux disease without esophagitis: Secondary | ICD-10-CM | POA: Diagnosis not present

## 2014-12-08 DIAGNOSIS — J309 Allergic rhinitis, unspecified: Secondary | ICD-10-CM | POA: Diagnosis not present

## 2014-12-08 DIAGNOSIS — J45909 Unspecified asthma, uncomplicated: Secondary | ICD-10-CM | POA: Diagnosis not present

## 2014-12-08 DIAGNOSIS — E785 Hyperlipidemia, unspecified: Secondary | ICD-10-CM | POA: Diagnosis not present

## 2014-12-08 DIAGNOSIS — Z720 Tobacco use: Secondary | ICD-10-CM | POA: Diagnosis not present

## 2014-12-13 DIAGNOSIS — Z5181 Encounter for therapeutic drug level monitoring: Secondary | ICD-10-CM | POA: Diagnosis not present

## 2014-12-13 DIAGNOSIS — E785 Hyperlipidemia, unspecified: Secondary | ICD-10-CM | POA: Diagnosis not present

## 2014-12-13 DIAGNOSIS — J45909 Unspecified asthma, uncomplicated: Secondary | ICD-10-CM | POA: Diagnosis not present

## 2014-12-13 DIAGNOSIS — K219 Gastro-esophageal reflux disease without esophagitis: Secondary | ICD-10-CM | POA: Diagnosis not present

## 2014-12-13 DIAGNOSIS — R7301 Impaired fasting glucose: Secondary | ICD-10-CM | POA: Diagnosis not present

## 2014-12-13 DIAGNOSIS — J309 Allergic rhinitis, unspecified: Secondary | ICD-10-CM | POA: Diagnosis not present

## 2014-12-13 DIAGNOSIS — M25551 Pain in right hip: Secondary | ICD-10-CM | POA: Diagnosis not present

## 2014-12-13 DIAGNOSIS — E559 Vitamin D deficiency, unspecified: Secondary | ICD-10-CM | POA: Diagnosis not present

## 2014-12-13 DIAGNOSIS — Z720 Tobacco use: Secondary | ICD-10-CM | POA: Diagnosis not present

## 2014-12-25 DIAGNOSIS — R112 Nausea with vomiting, unspecified: Secondary | ICD-10-CM | POA: Diagnosis not present

## 2014-12-25 DIAGNOSIS — K219 Gastro-esophageal reflux disease without esophagitis: Secondary | ICD-10-CM | POA: Diagnosis not present

## 2014-12-25 DIAGNOSIS — R1013 Epigastric pain: Secondary | ICD-10-CM | POA: Diagnosis not present

## 2014-12-25 DIAGNOSIS — R635 Abnormal weight gain: Secondary | ICD-10-CM | POA: Diagnosis not present

## 2015-01-02 DIAGNOSIS — R1013 Epigastric pain: Secondary | ICD-10-CM | POA: Diagnosis not present

## 2015-01-02 DIAGNOSIS — R112 Nausea with vomiting, unspecified: Secondary | ICD-10-CM | POA: Diagnosis not present

## 2015-01-03 ENCOUNTER — Other Ambulatory Visit: Payer: Self-pay | Admitting: Gastroenterology

## 2015-01-03 DIAGNOSIS — R110 Nausea: Secondary | ICD-10-CM

## 2015-01-11 ENCOUNTER — Ambulatory Visit (HOSPITAL_COMMUNITY)
Admission: RE | Admit: 2015-01-11 | Discharge: 2015-01-11 | Disposition: A | Discharge status: Home / Self Care | Payer: Medicare Other | Source: Ambulatory Visit | Attending: Gastroenterology | Admitting: Gastroenterology

## 2015-01-11 DIAGNOSIS — R140 Abdominal distension (gaseous): Secondary | ICD-10-CM | POA: Insufficient documentation

## 2015-01-11 DIAGNOSIS — R112 Nausea with vomiting, unspecified: Secondary | ICD-10-CM | POA: Diagnosis not present

## 2015-01-11 DIAGNOSIS — R110 Nausea: Secondary | ICD-10-CM

## 2015-01-11 DIAGNOSIS — R109 Unspecified abdominal pain: Secondary | ICD-10-CM | POA: Insufficient documentation

## 2015-01-11 MED ORDER — TECHNETIUM TC 99M SULFUR COLLOID
2.0000 | Freq: Once | INTRAVENOUS | Status: AC | PRN
  Administered 2015-01-11: 2 via INTRAVENOUS

## 2015-01-26 ENCOUNTER — Ambulatory Visit (HOSPITAL_COMMUNITY): Admission: RE | Admit: 2015-01-26 | Payer: Medicare Other | Source: Ambulatory Visit

## 2015-01-26 ENCOUNTER — Ambulatory Visit (HOSPITAL_COMMUNITY): Payer: Medicare Other

## 2015-03-20 DIAGNOSIS — K219 Gastro-esophageal reflux disease without esophagitis: Secondary | ICD-10-CM | POA: Diagnosis not present

## 2015-03-20 DIAGNOSIS — R7303 Prediabetes: Secondary | ICD-10-CM | POA: Diagnosis not present

## 2015-03-20 DIAGNOSIS — Z3202 Encounter for pregnancy test, result negative: Secondary | ICD-10-CM | POA: Diagnosis not present

## 2015-03-20 DIAGNOSIS — M25551 Pain in right hip: Secondary | ICD-10-CM | POA: Diagnosis not present

## 2015-03-20 DIAGNOSIS — Z720 Tobacco use: Secondary | ICD-10-CM | POA: Diagnosis not present

## 2015-03-20 DIAGNOSIS — R739 Hyperglycemia, unspecified: Secondary | ICD-10-CM | POA: Diagnosis not present

## 2015-03-20 DIAGNOSIS — E559 Vitamin D deficiency, unspecified: Secondary | ICD-10-CM | POA: Diagnosis not present

## 2015-03-20 DIAGNOSIS — J45909 Unspecified asthma, uncomplicated: Secondary | ICD-10-CM | POA: Diagnosis not present

## 2015-03-20 DIAGNOSIS — J309 Allergic rhinitis, unspecified: Secondary | ICD-10-CM | POA: Diagnosis not present

## 2015-03-20 DIAGNOSIS — E785 Hyperlipidemia, unspecified: Secondary | ICD-10-CM | POA: Diagnosis not present

## 2015-03-29 DIAGNOSIS — M25551 Pain in right hip: Secondary | ICD-10-CM | POA: Diagnosis not present

## 2015-04-27 DIAGNOSIS — R600 Localized edema: Secondary | ICD-10-CM | POA: Diagnosis not present

## 2015-04-27 DIAGNOSIS — M25551 Pain in right hip: Secondary | ICD-10-CM | POA: Diagnosis not present

## 2015-07-19 DIAGNOSIS — Z720 Tobacco use: Secondary | ICD-10-CM | POA: Diagnosis not present

## 2015-07-19 DIAGNOSIS — J45909 Unspecified asthma, uncomplicated: Secondary | ICD-10-CM | POA: Diagnosis not present

## 2015-07-19 DIAGNOSIS — J309 Allergic rhinitis, unspecified: Secondary | ICD-10-CM | POA: Diagnosis not present

## 2015-07-19 DIAGNOSIS — R739 Hyperglycemia, unspecified: Secondary | ICD-10-CM | POA: Diagnosis not present

## 2015-07-19 DIAGNOSIS — R202 Paresthesia of skin: Secondary | ICD-10-CM | POA: Diagnosis not present

## 2015-07-19 DIAGNOSIS — R569 Unspecified convulsions: Secondary | ICD-10-CM | POA: Diagnosis not present

## 2015-07-19 DIAGNOSIS — R7303 Prediabetes: Secondary | ICD-10-CM | POA: Diagnosis not present

## 2015-07-19 DIAGNOSIS — E785 Hyperlipidemia, unspecified: Secondary | ICD-10-CM | POA: Diagnosis not present

## 2015-07-19 DIAGNOSIS — Z5181 Encounter for therapeutic drug level monitoring: Secondary | ICD-10-CM | POA: Diagnosis not present

## 2015-07-19 DIAGNOSIS — K219 Gastro-esophageal reflux disease without esophagitis: Secondary | ICD-10-CM | POA: Diagnosis not present

## 2015-07-19 DIAGNOSIS — R55 Syncope and collapse: Secondary | ICD-10-CM | POA: Diagnosis not present

## 2015-07-19 DIAGNOSIS — E559 Vitamin D deficiency, unspecified: Secondary | ICD-10-CM | POA: Diagnosis not present

## 2015-09-10 ENCOUNTER — Ambulatory Visit: Payer: Medicare Other | Admitting: Neurology
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
## 2016-01-03 ENCOUNTER — Other Ambulatory Visit: Payer: Self-pay | Admitting: Obstetrics & Gynecology

## 2016-01-03 DIAGNOSIS — Z720 Tobacco use: Secondary | ICD-10-CM | POA: Diagnosis not present

## 2016-01-03 DIAGNOSIS — E669 Obesity, unspecified: Secondary | ICD-10-CM | POA: Diagnosis not present

## 2016-01-03 DIAGNOSIS — N898 Other specified noninflammatory disorders of vagina: Secondary | ICD-10-CM | POA: Diagnosis not present

## 2016-01-03 DIAGNOSIS — Z113 Encounter for screening for infections with a predominantly sexual mode of transmission: Secondary | ICD-10-CM | POA: Diagnosis not present

## 2016-01-03 DIAGNOSIS — Z86018 Personal history of other benign neoplasm: Secondary | ICD-10-CM | POA: Diagnosis not present

## 2016-01-03 DIAGNOSIS — Z3009 Encounter for other general counseling and advice on contraception: Secondary | ICD-10-CM | POA: Diagnosis not present

## 2016-01-03 DIAGNOSIS — Z9851 Tubal ligation status: Secondary | ICD-10-CM | POA: Diagnosis not present

## 2016-01-03 DIAGNOSIS — N939 Abnormal uterine and vaginal bleeding, unspecified: Secondary | ICD-10-CM | POA: Diagnosis not present

## 2016-03-04 DIAGNOSIS — Z01118 Encounter for examination of ears and hearing with other abnormal findings: Secondary | ICD-10-CM | POA: Diagnosis not present

## 2016-03-04 DIAGNOSIS — Z131 Encounter for screening for diabetes mellitus: Secondary | ICD-10-CM | POA: Diagnosis not present

## 2016-03-04 DIAGNOSIS — Z0100 Encounter for examination of eyes and vision without abnormal findings: Secondary | ICD-10-CM | POA: Diagnosis not present

## 2016-03-04 DIAGNOSIS — Z113 Encounter for screening for infections with a predominantly sexual mode of transmission: Secondary | ICD-10-CM | POA: Diagnosis not present

## 2016-03-04 DIAGNOSIS — Z136 Encounter for screening for cardiovascular disorders: Secondary | ICD-10-CM | POA: Diagnosis not present

## 2016-03-04 DIAGNOSIS — R7303 Prediabetes: Secondary | ICD-10-CM | POA: Diagnosis not present

## 2016-03-04 DIAGNOSIS — H538 Other visual disturbances: Secondary | ICD-10-CM | POA: Diagnosis not present

## 2016-03-04 DIAGNOSIS — Z1389 Encounter for screening for other disorder: Secondary | ICD-10-CM | POA: Diagnosis not present

## 2016-03-04 DIAGNOSIS — J45909 Unspecified asthma, uncomplicated: Secondary | ICD-10-CM | POA: Diagnosis not present

## 2016-03-04 DIAGNOSIS — E785 Hyperlipidemia, unspecified: Secondary | ICD-10-CM | POA: Diagnosis not present

## 2016-03-04 DIAGNOSIS — K219 Gastro-esophageal reflux disease without esophagitis: Secondary | ICD-10-CM | POA: Diagnosis not present

## 2016-03-04 DIAGNOSIS — E559 Vitamin D deficiency, unspecified: Secondary | ICD-10-CM | POA: Diagnosis not present

## 2016-03-04 DIAGNOSIS — Z720 Tobacco use: Secondary | ICD-10-CM | POA: Diagnosis not present

## 2016-03-04 DIAGNOSIS — R569 Unspecified convulsions: Secondary | ICD-10-CM | POA: Diagnosis not present

## 2016-03-04 DIAGNOSIS — Z0000 Encounter for general adult medical examination without abnormal findings: Secondary | ICD-10-CM | POA: Diagnosis not present

## 2016-03-07 DIAGNOSIS — E785 Hyperlipidemia, unspecified: Secondary | ICD-10-CM | POA: Diagnosis not present

## 2016-03-07 DIAGNOSIS — Z1389 Encounter for screening for other disorder: Secondary | ICD-10-CM | POA: Diagnosis not present

## 2016-03-07 DIAGNOSIS — Z0100 Encounter for examination of eyes and vision without abnormal findings: Secondary | ICD-10-CM | POA: Diagnosis not present

## 2016-03-07 DIAGNOSIS — J45909 Unspecified asthma, uncomplicated: Secondary | ICD-10-CM | POA: Diagnosis not present

## 2016-03-07 DIAGNOSIS — R7303 Prediabetes: Secondary | ICD-10-CM | POA: Diagnosis not present

## 2016-03-07 DIAGNOSIS — Z0000 Encounter for general adult medical examination without abnormal findings: Secondary | ICD-10-CM | POA: Diagnosis not present

## 2016-03-07 DIAGNOSIS — Z01118 Encounter for examination of ears and hearing with other abnormal findings: Secondary | ICD-10-CM | POA: Diagnosis not present

## 2016-03-07 DIAGNOSIS — R202 Paresthesia of skin: Secondary | ICD-10-CM | POA: Diagnosis not present

## 2016-03-07 DIAGNOSIS — Z720 Tobacco use: Secondary | ICD-10-CM | POA: Diagnosis not present

## 2016-03-07 DIAGNOSIS — Z131 Encounter for screening for diabetes mellitus: Secondary | ICD-10-CM | POA: Diagnosis not present

## 2016-03-07 DIAGNOSIS — Z136 Encounter for screening for cardiovascular disorders: Secondary | ICD-10-CM | POA: Diagnosis not present

## 2016-03-07 DIAGNOSIS — E559 Vitamin D deficiency, unspecified: Secondary | ICD-10-CM | POA: Diagnosis not present

## 2016-03-07 DIAGNOSIS — Z113 Encounter for screening for infections with a predominantly sexual mode of transmission: Secondary | ICD-10-CM | POA: Diagnosis not present

## 2016-03-07 DIAGNOSIS — R569 Unspecified convulsions: Secondary | ICD-10-CM | POA: Diagnosis not present

## 2016-03-07 DIAGNOSIS — K219 Gastro-esophageal reflux disease without esophagitis: Secondary | ICD-10-CM | POA: Diagnosis not present

## 2016-03-07 DIAGNOSIS — R102 Pelvic and perineal pain: Secondary | ICD-10-CM | POA: Diagnosis not present

## 2016-03-07 DIAGNOSIS — Z23 Encounter for immunization: Secondary | ICD-10-CM | POA: Diagnosis not present

## 2016-04-10 DIAGNOSIS — Z720 Tobacco use: Secondary | ICD-10-CM | POA: Diagnosis not present

## 2016-04-10 DIAGNOSIS — R569 Unspecified convulsions: Secondary | ICD-10-CM | POA: Diagnosis not present

## 2016-04-10 DIAGNOSIS — R102 Pelvic and perineal pain: Secondary | ICD-10-CM | POA: Diagnosis not present

## 2016-04-10 DIAGNOSIS — E559 Vitamin D deficiency, unspecified: Secondary | ICD-10-CM | POA: Diagnosis not present

## 2016-04-10 DIAGNOSIS — E785 Hyperlipidemia, unspecified: Secondary | ICD-10-CM | POA: Diagnosis not present

## 2016-04-10 DIAGNOSIS — R7303 Prediabetes: Secondary | ICD-10-CM | POA: Diagnosis not present

## 2016-04-10 DIAGNOSIS — J45909 Unspecified asthma, uncomplicated: Secondary | ICD-10-CM | POA: Diagnosis not present

## 2016-04-10 DIAGNOSIS — Z23 Encounter for immunization: Secondary | ICD-10-CM | POA: Diagnosis not present

## 2016-04-10 DIAGNOSIS — K219 Gastro-esophageal reflux disease without esophagitis: Secondary | ICD-10-CM | POA: Diagnosis not present

## 2016-04-10 DIAGNOSIS — R202 Paresthesia of skin: Secondary | ICD-10-CM | POA: Diagnosis not present

## 2016-04-15 ENCOUNTER — Other Ambulatory Visit: Payer: Self-pay | Admitting: Physician Assistant

## 2016-04-15 DIAGNOSIS — R102 Pelvic and perineal pain: Secondary | ICD-10-CM

## 2016-04-24 DIAGNOSIS — R202 Paresthesia of skin: Secondary | ICD-10-CM | POA: Diagnosis not present

## 2016-04-24 DIAGNOSIS — E785 Hyperlipidemia, unspecified: Secondary | ICD-10-CM | POA: Diagnosis not present

## 2016-04-24 DIAGNOSIS — R569 Unspecified convulsions: Secondary | ICD-10-CM | POA: Diagnosis not present

## 2016-04-24 DIAGNOSIS — N898 Other specified noninflammatory disorders of vagina: Secondary | ICD-10-CM | POA: Diagnosis not present

## 2016-04-24 DIAGNOSIS — E559 Vitamin D deficiency, unspecified: Secondary | ICD-10-CM | POA: Diagnosis not present

## 2016-04-24 DIAGNOSIS — Z720 Tobacco use: Secondary | ICD-10-CM | POA: Diagnosis not present

## 2016-04-24 DIAGNOSIS — K219 Gastro-esophageal reflux disease without esophagitis: Secondary | ICD-10-CM | POA: Diagnosis not present

## 2016-04-24 DIAGNOSIS — R7303 Prediabetes: Secondary | ICD-10-CM | POA: Diagnosis not present

## 2016-04-24 DIAGNOSIS — J45909 Unspecified asthma, uncomplicated: Secondary | ICD-10-CM | POA: Diagnosis not present

## 2016-05-08 DIAGNOSIS — Z720 Tobacco use: Secondary | ICD-10-CM | POA: Diagnosis not present

## 2016-05-08 DIAGNOSIS — R7303 Prediabetes: Secondary | ICD-10-CM | POA: Diagnosis not present

## 2016-05-08 DIAGNOSIS — L710 Perioral dermatitis: Secondary | ICD-10-CM | POA: Diagnosis not present

## 2016-05-08 DIAGNOSIS — R569 Unspecified convulsions: Secondary | ICD-10-CM | POA: Diagnosis not present

## 2016-05-08 DIAGNOSIS — J45909 Unspecified asthma, uncomplicated: Secondary | ICD-10-CM | POA: Diagnosis not present

## 2016-05-08 DIAGNOSIS — R202 Paresthesia of skin: Secondary | ICD-10-CM | POA: Diagnosis not present

## 2016-05-08 DIAGNOSIS — E559 Vitamin D deficiency, unspecified: Secondary | ICD-10-CM | POA: Diagnosis not present

## 2016-05-08 DIAGNOSIS — K219 Gastro-esophageal reflux disease without esophagitis: Secondary | ICD-10-CM | POA: Diagnosis not present

## 2016-05-08 DIAGNOSIS — E785 Hyperlipidemia, unspecified: Secondary | ICD-10-CM | POA: Diagnosis not present

## 2016-08-08 DIAGNOSIS — R202 Paresthesia of skin: Secondary | ICD-10-CM | POA: Diagnosis not present

## 2016-08-08 DIAGNOSIS — Z124 Encounter for screening for malignant neoplasm of cervix: Secondary | ICD-10-CM | POA: Diagnosis not present

## 2016-08-08 DIAGNOSIS — K219 Gastro-esophageal reflux disease without esophagitis: Secondary | ICD-10-CM | POA: Diagnosis not present

## 2016-08-08 DIAGNOSIS — E559 Vitamin D deficiency, unspecified: Secondary | ICD-10-CM | POA: Diagnosis not present

## 2016-08-08 DIAGNOSIS — Z720 Tobacco use: Secondary | ICD-10-CM | POA: Diagnosis not present

## 2016-08-08 DIAGNOSIS — E785 Hyperlipidemia, unspecified: Secondary | ICD-10-CM | POA: Diagnosis not present

## 2016-08-08 DIAGNOSIS — N390 Urinary tract infection, site not specified: Secondary | ICD-10-CM | POA: Diagnosis not present

## 2016-08-08 DIAGNOSIS — R7303 Prediabetes: Secondary | ICD-10-CM | POA: Diagnosis not present

## 2016-08-08 DIAGNOSIS — R569 Unspecified convulsions: Secondary | ICD-10-CM | POA: Diagnosis not present

## 2016-08-08 DIAGNOSIS — Z113 Encounter for screening for infections with a predominantly sexual mode of transmission: Secondary | ICD-10-CM | POA: Diagnosis not present

## 2016-08-08 DIAGNOSIS — J45909 Unspecified asthma, uncomplicated: Secondary | ICD-10-CM | POA: Diagnosis not present

## 2017-03-04 DIAGNOSIS — R05 Cough: Secondary | ICD-10-CM | POA: Diagnosis not present

## 2017-03-04 DIAGNOSIS — J45909 Unspecified asthma, uncomplicated: Secondary | ICD-10-CM | POA: Diagnosis not present

## 2017-03-04 DIAGNOSIS — R569 Unspecified convulsions: Secondary | ICD-10-CM | POA: Diagnosis not present

## 2017-03-04 DIAGNOSIS — Z720 Tobacco use: Secondary | ICD-10-CM | POA: Diagnosis not present

## 2017-03-04 DIAGNOSIS — K219 Gastro-esophageal reflux disease without esophagitis: Secondary | ICD-10-CM | POA: Diagnosis not present

## 2017-03-04 DIAGNOSIS — E559 Vitamin D deficiency, unspecified: Secondary | ICD-10-CM | POA: Diagnosis not present

## 2017-03-04 DIAGNOSIS — M25551 Pain in right hip: Secondary | ICD-10-CM | POA: Diagnosis not present

## 2017-03-04 DIAGNOSIS — R202 Paresthesia of skin: Secondary | ICD-10-CM | POA: Diagnosis not present

## 2017-03-04 DIAGNOSIS — E785 Hyperlipidemia, unspecified: Secondary | ICD-10-CM | POA: Diagnosis not present

## 2017-03-04 DIAGNOSIS — R7303 Prediabetes: Secondary | ICD-10-CM | POA: Diagnosis not present

## 2017-03-04 DIAGNOSIS — J029 Acute pharyngitis, unspecified: Secondary | ICD-10-CM | POA: Diagnosis not present

## 2017-03-25 DIAGNOSIS — E785 Hyperlipidemia, unspecified: Secondary | ICD-10-CM | POA: Diagnosis not present

## 2017-04-15 DIAGNOSIS — E785 Hyperlipidemia, unspecified: Secondary | ICD-10-CM | POA: Diagnosis not present

## 2017-04-20 ENCOUNTER — Encounter (HOSPITAL_COMMUNITY): Payer: Self-pay | Admitting: Emergency Medicine

## 2017-04-20 ENCOUNTER — Other Ambulatory Visit: Payer: Self-pay

## 2017-04-20 ENCOUNTER — Emergency Department (HOSPITAL_COMMUNITY): Payer: Medicare Other

## 2017-04-20 ENCOUNTER — Emergency Department (HOSPITAL_COMMUNITY)
Admission: EM | Admit: 2017-04-20 | Discharge: 2017-04-21 | Disposition: A | Discharge status: Home / Self Care | Payer: Medicare Other | Attending: Emergency Medicine | Admitting: Emergency Medicine

## 2017-04-20 DIAGNOSIS — Z91040 Latex allergy status: Secondary | ICD-10-CM | POA: Diagnosis not present

## 2017-04-20 DIAGNOSIS — N12 Tubulo-interstitial nephritis, not specified as acute or chronic: Secondary | ICD-10-CM | POA: Insufficient documentation

## 2017-04-20 DIAGNOSIS — F17200 Nicotine dependence, unspecified, uncomplicated: Secondary | ICD-10-CM | POA: Insufficient documentation

## 2017-04-20 DIAGNOSIS — R112 Nausea with vomiting, unspecified: Secondary | ICD-10-CM | POA: Diagnosis not present

## 2017-04-20 DIAGNOSIS — Z79899 Other long term (current) drug therapy: Secondary | ICD-10-CM | POA: Diagnosis not present

## 2017-04-20 DIAGNOSIS — R079 Chest pain, unspecified: Secondary | ICD-10-CM | POA: Diagnosis not present

## 2017-04-20 DIAGNOSIS — R1011 Right upper quadrant pain: Secondary | ICD-10-CM | POA: Diagnosis not present

## 2017-04-20 DIAGNOSIS — R109 Unspecified abdominal pain: Secondary | ICD-10-CM | POA: Diagnosis not present

## 2017-04-20 DIAGNOSIS — R0602 Shortness of breath: Secondary | ICD-10-CM | POA: Diagnosis not present

## 2017-04-20 DIAGNOSIS — J45909 Unspecified asthma, uncomplicated: Secondary | ICD-10-CM | POA: Diagnosis not present

## 2017-04-20 DIAGNOSIS — R05 Cough: Secondary | ICD-10-CM | POA: Diagnosis not present

## 2017-04-20 LAB — BASIC METABOLIC PANEL
Anion gap: 11 (ref 5–15)
BUN: 9 mg/dL (ref 6–20)
CALCIUM: 9.2 mg/dL (ref 8.9–10.3)
CO2: 20 mmol/L — AB (ref 22–32)
Chloride: 103 mmol/L (ref 101–111)
Creatinine, Ser: 0.97 mg/dL (ref 0.44–1.00)
GFR calc non Af Amer: >60 mL/min (ref >60)
Glucose, Bld: 95 mg/dL (ref 65–99)
POTASSIUM: 3.3 mmol/L — AB (ref 3.5–5.1)
Sodium: 134 mmol/L — ABNORMAL LOW (ref 135–145)

## 2017-04-20 LAB — I-STAT TROPONIN, ED: TROPONIN I, POC: 0 ng/mL (ref 0.00–0.08)

## 2017-04-20 LAB — CBC
HEMATOCRIT: 33.5 % — AB (ref 36.0–46.0)
Hemoglobin: 11.5 g/dL — ABNORMAL LOW (ref 12.0–15.0)
MCH: 28.6 pg (ref 26.0–34.0)
MCHC: 34.3 g/dL (ref 30.0–36.0)
MCV: 83.3 fL (ref 78.0–100.0)
Platelets: 213 10*3/uL (ref 150–400)
RBC: 4.02 MIL/uL (ref 3.87–5.11)
RDW: 14.5 % (ref 11.5–15.5)
WBC: 12.8 10*3/uL — ABNORMAL HIGH (ref 4.0–10.5)

## 2017-04-20 LAB — I-STAT BETA HCG BLOOD, ED (MC, WL, AP ONLY): I-stat hCG, quantitative: <5 m[IU]/mL (ref <5)

## 2017-04-20 LAB — TROPONIN I: Troponin I: 0.03 ng/mL (ref <0.03)

## 2017-04-20 MED ORDER — ONDANSETRON HCL 4 MG/2ML IJ SOLN
4.0000 mg | Freq: Once | INTRAMUSCULAR | Status: AC
  Administered 2017-04-20: 4 mg via INTRAVENOUS
  Filled 2017-04-20: qty 2

## 2017-04-20 MED ORDER — SODIUM CHLORIDE 0.9 % IV BOLUS
1000.0000 mL | Freq: Once | INTRAVENOUS | Status: AC
  Administered 2017-04-20: 1000 mL via INTRAVENOUS

## 2017-04-20 MED ORDER — SODIUM CHLORIDE 0.9 % IV BOLUS
500.0000 mL | Freq: Once | INTRAVENOUS | Status: AC
Start: 2017-04-20 — End: 2017-04-20
  Administered 2017-04-20: 500 mL via INTRAVENOUS

## 2017-04-20 NOTE — ED Provider Notes (Signed)
Patient signed out to me at shift change.  Patient presented to emergency department complaining of right-sided chest/abdominal pain onset this morning.  Patient has had associated nausea and vomiting.  Initial set of lab work was obtained in triage, which included troponin, pregnancy test which is negative, basic metabolic panel, CBC, which showed elevated white blood cell count of 12.8.  After evaluation by previous provider, LFTs and lipase was added to evaluate for liver/gallbladder/pancreatic function given the location of the pain.  Ultrasound of the right upper quadrant ordered as well.  Patient signed out to me pending these results.    Patient reassessed, ultrasound does not show any abnormalities, upper limit normal, bile duct, ultrasound does not have a very good view of her gallbladder.  LFTs and lipase normal.  I reassessed patient, patient does not have any tenderness in the right upper quadrant on my exam but does have significant right CVA tenderness and tenderness in the right lower quadrant.  Urinalysis has not been ordered were obtained, I will add that to make sure patient does not have pyelonephritis.  Will also get CT abdomen pelvis to make sure this is not appendicitis.  Patient's troponin did come back at 0.03, however patient has no chest pain, no exertional shortness of breath, I do not have very high suspicion that this is cardiac.   3:17 AM Patient reassessed again, she is in no acute distress, her urine does show too numerous to count white blood cells, bacteria, and positive for nitrites.  I suspect the patient may have pyelonephritis.  Will start on Cipro, pain medications and antiemetics, stable for discharge home.  Vital signs are normal.  She is in no acute distress.  Return precautions discussed.  Vitals:   04/21/17 0200 04/21/17 0230 04/21/17 0245 04/21/17 0300  BP: 130/65 (!) 115/97 124/81 126/86  Pulse: 81 89 91 94  Resp: 18 14  19   Temp:      TempSrc:       SpO2: 100% 100% 100% 99%      Jeannett Senior, PA-C 04/21/17 0318    Merryl Hacker, MD 04/21/17 [phone]

## 2017-04-20 NOTE — ED Provider Notes (Signed)
[facility] EMERGENCY DEPARTMENT Provider Note   CSN: [id number] Arrival date & time: 04/20/17  1358     History   Chief Complaint Chief Complaint  Patient presents with  . Chest Pain  . Shortness of Breath    HPI Kendra Roberts is a 42 y.o. female.  HPI Patient presents with upper abdominal pain going to her chest.  Began this morning.  Has had nausea and vomiting.  States pain is worse with eating.  Has had little appetite.  States she began vomiting a couple days ago.  Now had worsening pain.  Pain is sharp.  Has had some chills.  No diarrhea or constipation.  States she was able to take a nap while waiting the 7-1/2 hours in the waiting room to come back to see me.   Past Medical History:  Diagnosis Date  . Arthritis   . Asthma   . Kidney stones   . Seizures ([place])    One seizure years ago    Patient Active Problem List   Diagnosis Date Noted  . OBESITY 09/29/2007  . CONSTIPATION 09/29/2007    Past Surgical History:  Procedure Laterality Date  . KIDNEY STONE SURGERY    . TUBAL LIGATION       OB History    Gravida  6   Para  5   Term  5   Preterm      AB  1   Living  4     SAB  1   TAB      Ectopic      Multiple      Live Births               Home Medications    Prior to Admission medications   Medication Sig Start Date End Date Taking? Authorizing Provider  albuterol (PROVENTIL HFA;VENTOLIN HFA) 108 (90 BASE) MCG/ACT inhaler Inhale 2 puffs into the lungs every 6 (six) hours as needed for wheezing.    [provider]  albuterol (PROVENTIL) (2.5 MG/3ML) 0.083% nebulizer solution Take 2.5 mg by nebulization every 6 (six) hours as needed for wheezing or shortness of breath.    [provider]  cyclobenzaprine (FLEXERIL) 10 MG tablet Take 1 tablet (10 mg total) by mouth 2 (two) times daily as needed for muscle spasms. 10/05/14   Ashley Murrain, NP  esomeprazole (NEXIUM) 40 MG capsule Take 40 mg by mouth  daily before breakfast.    [provider]  HYDROcodone-acetaminophen (NORCO/VICODIN) 5-325 MG per tablet Take 1 tablet by mouth every 4 (four) hours as needed. 10/05/14   Ashley Murrain, NP  hydrOXYzine (ATARAX/VISTARIL) 50 MG tablet Take 50 mg by mouth 3 (three) times daily as needed for itching or anxiety.    [provider]  lamoTRIgine (LAMICTAL) 25 MG tablet Take 25 mg by mouth 2 (two) times daily.    [provider]  naproxen (NAPROSYN) 500 MG tablet Take 1 tablet (500 mg total) by mouth 2 (two) times daily. 10/05/14   Ashley Murrain, NP  predniSONE (DELTASONE) 10 MG tablet Take 1 tablet (10 mg total) by mouth daily. Patient not taking: Reported on 07/04/2014 07/08/12   Delos Haring, PA-C  QUEtiapine (SEROQUEL) 100 MG tablet Take 100 mg by mouth at bedtime.    [provider]    Family History History reviewed. No pertinent family history.  Social History Social History   Tobacco Use  . Smoking status: Current Some Day  Smoker  . Smokeless tobacco: Never Used  Substance Use Topics  . Alcohol use: No  . Drug use: No     Allergies   Latex and Penicillins   Review of Systems Review of Systems  Constitutional: Positive for appetite change. Negative for fever.  HENT: Negative for congestion.   Respiratory: Positive for shortness of breath.   Cardiovascular: Positive for chest pain.  Gastrointestinal: Positive for abdominal pain, nausea and vomiting.  Genitourinary: Negative for dysuria.  Musculoskeletal: Negative for back pain.  Skin: Negative for rash.  Neurological: Negative for seizures.  Hematological: Negative for adenopathy.  Psychiatric/Behavioral: Negative for confusion.     Physical Exam Updated Vital Signs BP 127/86   Pulse 73   Temp 98.7 F (37.1 C)   Resp 20   SpO2 100%   Physical Exam  Constitutional: She appears well-developed.  HENT:  Head: Normocephalic.  Cardiovascular: Normal rate, regular rhythm and normal  pulses.  Pulmonary/Chest: Effort normal and breath sounds normal.  Abdominal: There is tenderness.  Epigastric to right upper quadrant tenderness without rebound or guarding.  No hernia palpated.  Musculoskeletal:       Right lower leg: She exhibits no edema.       Left lower leg: She exhibits no edema.  Neurological: She is alert.  Skin: Skin is warm. Capillary refill takes less than 2 seconds.  Psychiatric: Her behavior is normal.     ED Treatments / Results  Labs (all labs ordered are listed, but only abnormal results are displayed) Labs Reviewed  BASIC METABOLIC PANEL - Abnormal; Notable for the following components:      Result Value   Sodium 134 (*)    Potassium 3.3 (*)    CO2 20 (*)    All other components within normal limits  CBC - Abnormal; Notable for the following components:   WBC 12.8 (*)    Hemoglobin 11.5 (*)    HCT 33.5 (*)    All other components within normal limits  TROPONIN I  HEPATIC FUNCTION PANEL  LIPASE, BLOOD  I-STAT TROPONIN, ED  I-STAT BETA HCG BLOOD, ED ([facility], [facility], [facility] ONLY)    EKG EKG Interpretation  Date/Time:  Monday April 20 2017 14:06:12 EDT Ventricular Rate:  115 PR Interval:  128 QRS Duration: 74 QT Interval:  338 QTC Calculation: 467 R Axis:   52 Text Interpretation:  Sinus tachycardia Low voltage QRS Borderline ECG Confirmed by Davonna Belling [phone]) on 04/20/2017 9:36:19 PM   Radiology Dg Chest 2 View  Result Date: 04/20/2017 CLINICAL DATA:  Shortness of breath and cough EXAM: CHEST - 2 VIEW COMPARISON:  December 30, 2010 FINDINGS: There is no edema or consolidation. The heart size and pulmonary vascularity are normal. No adenopathy. No bone lesions. IMPRESSION: No edema or consolidation. Electronically Signed   By: Lowella Grip III M.D.   On: 04/20/2017 16:26    Procedures Procedures (including critical care time)  Medications Ordered in ED Medications  ondansetron (ZOFRAN) injection 4 mg (has no administration in  time range)  sodium chloride 0.9 % bolus 500 mL (has no administration in time range)     Initial Impression / Assessment and Plan / ED Course  I have reviewed the triage vital signs and the nursing notes.  Pertinent labs & imaging results that were available during my care of the patient were reviewed by me and considered in my medical decision making (see chart for details).     Patient presents with nausea vomiting chest  pain and upper abdominal pain.  Right upper quadrant tenderness.  Triage labs reassuring but did not look at either the liver or lipase.  Will get ultrasound and give antiemetics and saline.  Doubt cardiac cause of the pain.  Doubt pulmonary embolism.  Heart rate is normalized.  Care will be turned over to oncoming provider.  Final Clinical Impressions(s) / ED Diagnoses   Final diagnoses:  Right upper quadrant abdominal pain  Nausea and vomiting, intractability of vomiting not specified, unspecified vomiting type    ED Discharge Orders    None       Davonna Belling, MD 04/20/17 2142

## 2017-04-20 NOTE — ED Triage Notes (Signed)
Pt to ER for shortness of breath and chest pain that began this morning. Pt extremely anxious in triage.

## 2017-04-21 ENCOUNTER — Emergency Department (HOSPITAL_COMMUNITY): Payer: Medicare Other

## 2017-04-21 DIAGNOSIS — R1011 Right upper quadrant pain: Secondary | ICD-10-CM | POA: Diagnosis not present

## 2017-04-21 DIAGNOSIS — N12 Tubulo-interstitial nephritis, not specified as acute or chronic: Secondary | ICD-10-CM | POA: Diagnosis not present

## 2017-04-21 DIAGNOSIS — R109 Unspecified abdominal pain: Secondary | ICD-10-CM | POA: Diagnosis not present

## 2017-04-21 LAB — URINALYSIS, ROUTINE W REFLEX MICROSCOPIC
BILIRUBIN URINE: NEGATIVE
GLUCOSE, UA: NEGATIVE mg/dL
Ketones, ur: 20 mg/dL — AB
NITRITE: POSITIVE — AB
PH: 6 (ref 5.0–8.0)
Protein, ur: NEGATIVE mg/dL
SPECIFIC GRAVITY, URINE: 1.014 (ref 1.005–1.030)

## 2017-04-21 LAB — HEPATIC FUNCTION PANEL
ALBUMIN: 3.3 g/dL — AB (ref 3.5–5.0)
ALT: 15 U/L (ref 14–54)
AST: 28 U/L (ref 15–41)
Alkaline Phosphatase: 70 U/L (ref 38–126)
BILIRUBIN INDIRECT: 0.4 mg/dL (ref 0.3–0.9)
Bilirubin, Direct: 0.1 mg/dL (ref 0.1–0.5)
TOTAL PROTEIN: 7.3 g/dL (ref 6.5–8.1)
Total Bilirubin: 0.5 mg/dL (ref 0.3–1.2)

## 2017-04-21 LAB — LIPASE, BLOOD: LIPASE: 23 U/L (ref 11–51)

## 2017-04-21 MED ORDER — TRAMADOL HCL 50 MG PO TABS: 50.0000 mg | ORAL_TABLET | Freq: Four times a day (QID) | ORAL | 0 refills | Status: DC | PRN

## 2017-04-21 MED ORDER — IOPAMIDOL (ISOVUE-300) INJECTION 61%
INTRAVENOUS | Status: AC
  Administered 2017-04-21: 100 mL
  Filled 2017-04-21: qty 100

## 2017-04-21 MED ORDER — ONDANSETRON 8 MG PO TBDP: 8.0000 mg | ORAL_TABLET | Freq: Three times a day (TID) | ORAL | 0 refills | Status: DC | PRN

## 2017-04-21 MED ORDER — CIPROFLOXACIN HCL 500 MG PO TABS
500.0000 mg | ORAL_TABLET | Freq: Once | ORAL | Status: AC
  Administered 2017-04-21: 500 mg via ORAL
  Filled 2017-04-21: qty 1

## 2017-04-21 MED ORDER — CIPROFLOXACIN HCL 500 MG PO TABS: 500.0000 mg | ORAL_TABLET | Freq: Two times a day (BID) | ORAL | 0 refills | Status: DC

## 2017-04-21 NOTE — ED Notes (Signed)
Patient transported to CT 

## 2017-04-21 NOTE — Discharge Instructions (Addendum)
Take cipro as prescribed until all gone for infection. Tylenol or motrin for pain. Tramadol for severe pain. Zofran for nasuea and vomiting. Follow up with family doctor in 3 days for recheck.

## 2017-05-12 ENCOUNTER — Other Ambulatory Visit: Payer: Self-pay

## 2017-05-12 ENCOUNTER — Encounter (HOSPITAL_COMMUNITY): Payer: Self-pay | Admitting: Emergency Medicine

## 2017-05-12 ENCOUNTER — Emergency Department (HOSPITAL_COMMUNITY): Payer: Medicare Other

## 2017-05-12 ENCOUNTER — Emergency Department (HOSPITAL_COMMUNITY)
Admission: EM | Admit: 2017-05-12 | Discharge: 2017-05-12 | Disposition: A | Discharge status: Home / Self Care | Payer: Medicare Other | Attending: Emergency Medicine | Admitting: Emergency Medicine

## 2017-05-12 DIAGNOSIS — J45909 Unspecified asthma, uncomplicated: Secondary | ICD-10-CM | POA: Insufficient documentation

## 2017-05-12 DIAGNOSIS — Y9389 Activity, other specified: Secondary | ICD-10-CM | POA: Diagnosis not present

## 2017-05-12 DIAGNOSIS — S5001XA Contusion of right elbow, initial encounter: Secondary | ICD-10-CM | POA: Insufficient documentation

## 2017-05-12 DIAGNOSIS — W228XXA Striking against or struck by other objects, initial encounter: Secondary | ICD-10-CM | POA: Insufficient documentation

## 2017-05-12 DIAGNOSIS — F17200 Nicotine dependence, unspecified, uncomplicated: Secondary | ICD-10-CM | POA: Insufficient documentation

## 2017-05-12 DIAGNOSIS — Y999 Unspecified external cause status: Secondary | ICD-10-CM | POA: Insufficient documentation

## 2017-05-12 DIAGNOSIS — Y929 Unspecified place or not applicable: Secondary | ICD-10-CM | POA: Diagnosis not present

## 2017-05-12 DIAGNOSIS — Z79899 Other long term (current) drug therapy: Secondary | ICD-10-CM | POA: Insufficient documentation

## 2017-05-12 DIAGNOSIS — Z91040 Latex allergy status: Secondary | ICD-10-CM | POA: Diagnosis not present

## 2017-05-12 DIAGNOSIS — S59901A Unspecified injury of right elbow, initial encounter: Secondary | ICD-10-CM | POA: Diagnosis not present

## 2017-05-12 DIAGNOSIS — S59801A Other specified injuries of right elbow, initial encounter: Secondary | ICD-10-CM | POA: Diagnosis present

## 2017-05-12 MED ORDER — IBUPROFEN 800 MG PO TABS: 800.0000 mg | ORAL_TABLET | Freq: Three times a day (TID) | ORAL | 0 refills | Status: DC

## 2017-05-12 NOTE — ED Provider Notes (Signed)
[facility] EMERGENCY DEPARTMENT Provider Note   CSN: [id number] Arrival date & time: 05/12/17  1928     History   Chief Complaint Chief Complaint  Patient presents with  . Arm Pain    HPI Kendra Roberts is a 42 y.o. female.  Patient states she struck her right elbow sharply on the edge of a desk at work about 10 days ago. She reports a "tingling" sensation and pain with movement of the elbow that moves into the forearm. Some bruising noted in the out side aspect of the [facility] space.  The history is provided by the patient. No language interpreter was used.  Arm Injury   This is a new problem. The current episode started more than 1 week ago. The problem has not changed since onset.The pain is present in the right elbow. The quality of the pain is described as aching. The pain is moderate. Associated symptoms include tingling. The symptoms are aggravated by cold. She has tried OTC pain medications for the symptoms. The treatment provided mild relief. There has been a history of trauma.    Past Medical History:  Diagnosis Date  . Arthritis   . Asthma   . Kidney stones   . Seizures ([place])    One seizure years ago    Patient Active Problem List   Diagnosis Date Noted  . OBESITY 09/29/2007  . CONSTIPATION 09/29/2007    Past Surgical History:  Procedure Laterality Date  . KIDNEY STONE SURGERY    . TUBAL LIGATION       OB History    Gravida  6   Para  5   Term  5   Preterm      AB  1   Living  4     SAB  1   TAB      Ectopic      Multiple      Live Births               Home Medications    Prior to Admission medications   Medication Sig Start Date End Date Taking? Authorizing Provider  albuterol (PROVENTIL HFA;VENTOLIN HFA) 108 (90 BASE) MCG/ACT inhaler Inhale 2 puffs into the lungs every 6 (six) hours as needed for wheezing.    [provider]  albuterol (PROVENTIL) (2.5 MG/3ML) 0.083% nebulizer solution Take 2.5 mg  by nebulization every 6 (six) hours as needed for wheezing or shortness of breath.    [provider]  ciprofloxacin (CIPRO) 500 MG tablet Take 1 tablet (500 mg total) by mouth 2 (two) times daily. 04/21/17   Kirichenko, Lahoma Rocker, PA-C  cyclobenzaprine (FLEXERIL) 10 MG tablet Take 1 tablet (10 mg total) by mouth 2 (two) times daily as needed for muscle spasms. Patient not taking: Reported on 04/21/2017 10/05/14   Ashley Murrain, NP  esomeprazole (NEXIUM) 40 MG capsule Take 40 mg by mouth daily before breakfast.    [provider]  HYDROcodone-acetaminophen (NORCO/VICODIN) 5-325 MG per tablet Take 1 tablet by mouth every 4 (four) hours as needed. Patient not taking: Reported on 04/21/2017 10/05/14   Ashley Murrain, NP  hydrOXYzine (ATARAX/VISTARIL) 50 MG tablet Take 50 mg by mouth 3 (three) times daily as needed for itching or anxiety.    [provider]  lamoTRIgine (LAMICTAL) 25 MG tablet Take 25 mg by mouth 2 (two) times daily.    [provider]  naproxen (NAPROSYN) 500 MG tablet Take 1 tablet (500 mg total)  by mouth 2 (two) times daily. Patient not taking: Reported on 04/21/2017 10/05/14   Ashley Murrain, NP  ondansetron (ZOFRAN ODT) 8 MG disintegrating tablet Take 1 tablet (8 mg total) by mouth every 8 (eight) hours as needed for nausea or vomiting. 04/21/17   Kirichenko, Lahoma Rocker, PA-C  predniSONE (DELTASONE) 10 MG tablet Take 1 tablet (10 mg total) by mouth daily. Patient not taking: Reported on 07/04/2014 07/08/12   Delos Haring, PA-C  QUEtiapine (SEROQUEL) 100 MG tablet Take 100 mg by mouth at bedtime.    [provider]  traMADol (ULTRAM) 50 MG tablet Take 1 tablet (50 mg total) by mouth every 6 (six) hours as needed. 04/21/17   Jeannett Senior, PA-C    Family History No family history on file.  Social History Social History   Tobacco Use  . Smoking status: Current Some Day Smoker  . Smokeless tobacco: Never Used  Substance Use Topics  . Alcohol  use: No  . Drug use: No     Allergies   Latex and Penicillins   Review of Systems Review of Systems  Musculoskeletal: Positive for arthralgias.  Neurological: Positive for tingling.  All other systems reviewed and are negative.    Physical Exam Updated Vital Signs BP 120/88 (BP Location: Left Arm)   Pulse 86   Temp 99 F (37.2 C) (Oral)   Resp 18   Ht 5' 5"  (1.651 m)   Wt 98 kg (216 lb)   LMP 03/28/2017 (Approximate)   SpO2 100%   BMI 35.94 kg/m   Physical Exam  Constitutional: She is oriented to person, place, and time. She appears well-developed and well-nourished.  HENT:  Head: Normocephalic.  Eyes: Conjunctivae are normal.  Neck: Neck supple.  Cardiovascular: Normal rate and regular rhythm.  Pulmonary/Chest: Effort normal and breath sounds normal.  Abdominal: Soft. Bowel sounds are normal.  Musculoskeletal: Normal range of motion. She exhibits tenderness.       Right shoulder: She exhibits crepitus.       Right elbow: She exhibits no effusion and no deformity. Tenderness found. Olecranon process tenderness noted.  Mildly decreased grip strength on right as compared to the left. Normal sensation. Able to move right elbow through full ROM with some discomfort when fully flexed and extended. Small area of bruising noted to the lateral aspect of the right AC space.  Neurological: She is alert and oriented to person, place, and time. No sensory deficit.  Skin: Skin is warm and dry.  Psychiatric: She has a normal mood and affect.  Nursing note and vitals reviewed.    ED Treatments / Results  Labs (all labs ordered are listed, but only abnormal results are displayed) Labs Reviewed - No data to display  EKG None  Radiology Dg Elbow Complete Right  Result Date: 05/12/2017 CLINICAL DATA:  Right elbow injury EXAM: RIGHT ELBOW - COMPLETE 3+ VIEW COMPARISON:  None. FINDINGS: There is no evidence of fracture, dislocation, or joint effusion. There is no evidence of  arthropathy or other focal bone abnormality. Soft tissues are unremarkable. IMPRESSION: Negative. Electronically Signed   By: Donavan Foil M.D.   On: 05/12/2017 21:46    Procedures Procedures (including critical care time)  Medications Ordered in ED Medications - No data to display   Initial Impression / Assessment and Plan / ED Course  I have reviewed the triage vital signs and the nursing notes.  Pertinent labs & imaging results that were available during my care of the patient were  reviewed by me and considered in my medical decision making (see chart for details).     Patient X-Ray negative for obvious fracture or dislocation.  Pt advised to follow up with orthopedics. Patient given sling while in the ED. Conservative treatment recommended and discussed. Patient will be discharged home & is agreeable with above plan. Returns precautions discussed. Pt appears safe for discharge.  Final Clinical Impressions(s) / ED Diagnoses   Final diagnoses:  Contusion of right elbow, initial encounter    ED Discharge Orders        Ordered    ibuprofen (ADVIL,MOTRIN) 800 MG tablet  3 times daily     05/12/17 2215       Etta Quill, NP 05/12/17 2217    Valarie Merino, MD 05/13/17 1259

## 2017-05-12 NOTE — ED Notes (Signed)
Called for room but no answer in waiting

## 2017-05-12 NOTE — ED Notes (Signed)
Paged ortho 

## 2017-05-12 NOTE — ED Triage Notes (Signed)
Pt st's she hit her right elbow on something at work approx 10 days ago.  Pt st's she has continued to have pain in right elbow and forearm

## 2017-05-13 DIAGNOSIS — E785 Hyperlipidemia, unspecified: Secondary | ICD-10-CM | POA: Diagnosis not present

## 2017-05-20 DIAGNOSIS — N760 Acute vaginitis: Secondary | ICD-10-CM | POA: Diagnosis not present

## 2017-05-20 DIAGNOSIS — Z720 Tobacco use: Secondary | ICD-10-CM | POA: Diagnosis not present

## 2017-05-20 DIAGNOSIS — E559 Vitamin D deficiency, unspecified: Secondary | ICD-10-CM | POA: Diagnosis not present

## 2017-05-20 DIAGNOSIS — R569 Unspecified convulsions: Secondary | ICD-10-CM | POA: Diagnosis not present

## 2017-05-20 DIAGNOSIS — M25551 Pain in right hip: Secondary | ICD-10-CM | POA: Diagnosis not present

## 2017-05-20 DIAGNOSIS — R7303 Prediabetes: Secondary | ICD-10-CM | POA: Diagnosis not present

## 2017-05-20 DIAGNOSIS — J45909 Unspecified asthma, uncomplicated: Secondary | ICD-10-CM | POA: Diagnosis not present

## 2017-05-20 DIAGNOSIS — K219 Gastro-esophageal reflux disease without esophagitis: Secondary | ICD-10-CM | POA: Diagnosis not present

## 2017-05-20 DIAGNOSIS — E785 Hyperlipidemia, unspecified: Secondary | ICD-10-CM | POA: Diagnosis not present

## 2017-05-20 DIAGNOSIS — R202 Paresthesia of skin: Secondary | ICD-10-CM | POA: Diagnosis not present

## 2017-06-10 DIAGNOSIS — J45909 Unspecified asthma, uncomplicated: Secondary | ICD-10-CM | POA: Diagnosis not present

## 2017-06-10 DIAGNOSIS — E785 Hyperlipidemia, unspecified: Secondary | ICD-10-CM | POA: Diagnosis not present

## 2017-06-17 DIAGNOSIS — K219 Gastro-esophageal reflux disease without esophagitis: Secondary | ICD-10-CM | POA: Diagnosis not present

## 2017-06-17 DIAGNOSIS — J302 Other seasonal allergic rhinitis: Secondary | ICD-10-CM | POA: Diagnosis not present

## 2017-06-17 DIAGNOSIS — R7303 Prediabetes: Secondary | ICD-10-CM | POA: Diagnosis not present

## 2017-06-17 DIAGNOSIS — N760 Acute vaginitis: Secondary | ICD-10-CM | POA: Diagnosis not present

## 2017-06-17 DIAGNOSIS — M25551 Pain in right hip: Secondary | ICD-10-CM | POA: Diagnosis not present

## 2017-06-17 DIAGNOSIS — J45909 Unspecified asthma, uncomplicated: Secondary | ICD-10-CM | POA: Diagnosis not present

## 2017-06-17 DIAGNOSIS — R202 Paresthesia of skin: Secondary | ICD-10-CM | POA: Diagnosis not present

## 2017-06-17 DIAGNOSIS — Z720 Tobacco use: Secondary | ICD-10-CM | POA: Diagnosis not present

## 2017-06-17 DIAGNOSIS — R569 Unspecified convulsions: Secondary | ICD-10-CM | POA: Diagnosis not present

## 2017-06-17 DIAGNOSIS — E785 Hyperlipidemia, unspecified: Secondary | ICD-10-CM | POA: Diagnosis not present

## 2017-06-17 DIAGNOSIS — E559 Vitamin D deficiency, unspecified: Secondary | ICD-10-CM | POA: Diagnosis not present

## 2017-06-18 DIAGNOSIS — M25551 Pain in right hip: Secondary | ICD-10-CM | POA: Diagnosis not present

## 2017-06-18 DIAGNOSIS — M545 Low back pain: Secondary | ICD-10-CM | POA: Diagnosis not present

## 2017-06-18 DIAGNOSIS — M25521 Pain in right elbow: Secondary | ICD-10-CM | POA: Diagnosis not present

## 2017-07-24 DIAGNOSIS — R7303 Prediabetes: Secondary | ICD-10-CM | POA: Diagnosis not present

## 2017-07-24 DIAGNOSIS — J45909 Unspecified asthma, uncomplicated: Secondary | ICD-10-CM | POA: Diagnosis not present

## 2017-07-24 DIAGNOSIS — R569 Unspecified convulsions: Secondary | ICD-10-CM | POA: Diagnosis not present

## 2017-07-24 DIAGNOSIS — E785 Hyperlipidemia, unspecified: Secondary | ICD-10-CM | POA: Diagnosis not present

## 2017-07-24 DIAGNOSIS — K219 Gastro-esophageal reflux disease without esophagitis: Secondary | ICD-10-CM | POA: Diagnosis not present

## 2017-07-24 DIAGNOSIS — Z720 Tobacco use: Secondary | ICD-10-CM | POA: Diagnosis not present

## 2017-07-24 DIAGNOSIS — E559 Vitamin D deficiency, unspecified: Secondary | ICD-10-CM | POA: Diagnosis not present

## 2017-07-24 DIAGNOSIS — J302 Other seasonal allergic rhinitis: Secondary | ICD-10-CM | POA: Diagnosis not present

## 2017-07-24 DIAGNOSIS — R202 Paresthesia of skin: Secondary | ICD-10-CM | POA: Diagnosis not present

## 2017-08-28 DIAGNOSIS — E559 Vitamin D deficiency, unspecified: Secondary | ICD-10-CM | POA: Diagnosis not present

## 2017-08-28 DIAGNOSIS — J302 Other seasonal allergic rhinitis: Secondary | ICD-10-CM | POA: Diagnosis not present

## 2017-08-28 DIAGNOSIS — R569 Unspecified convulsions: Secondary | ICD-10-CM | POA: Diagnosis not present

## 2017-08-28 DIAGNOSIS — R202 Paresthesia of skin: Secondary | ICD-10-CM | POA: Diagnosis not present

## 2017-08-28 DIAGNOSIS — Z720 Tobacco use: Secondary | ICD-10-CM | POA: Diagnosis not present

## 2017-08-28 DIAGNOSIS — R7303 Prediabetes: Secondary | ICD-10-CM | POA: Diagnosis not present

## 2017-08-28 DIAGNOSIS — J45909 Unspecified asthma, uncomplicated: Secondary | ICD-10-CM | POA: Diagnosis not present

## 2017-08-28 DIAGNOSIS — B372 Candidiasis of skin and nail: Secondary | ICD-10-CM | POA: Diagnosis not present

## 2017-08-28 DIAGNOSIS — K219 Gastro-esophageal reflux disease without esophagitis: Secondary | ICD-10-CM | POA: Diagnosis not present

## 2017-08-28 DIAGNOSIS — E785 Hyperlipidemia, unspecified: Secondary | ICD-10-CM | POA: Diagnosis not present

## 2017-08-28 DIAGNOSIS — A630 Anogenital (venereal) warts: Secondary | ICD-10-CM | POA: Diagnosis not present

## 2017-12-15 DIAGNOSIS — E785 Hyperlipidemia, unspecified: Secondary | ICD-10-CM | POA: Diagnosis not present

## 2017-12-15 DIAGNOSIS — J302 Other seasonal allergic rhinitis: Secondary | ICD-10-CM | POA: Diagnosis not present

## 2017-12-15 DIAGNOSIS — Z720 Tobacco use: Secondary | ICD-10-CM | POA: Diagnosis not present

## 2017-12-15 DIAGNOSIS — R7303 Prediabetes: Secondary | ICD-10-CM | POA: Diagnosis not present

## 2017-12-15 DIAGNOSIS — E559 Vitamin D deficiency, unspecified: Secondary | ICD-10-CM | POA: Diagnosis not present

## 2017-12-15 DIAGNOSIS — R569 Unspecified convulsions: Secondary | ICD-10-CM | POA: Diagnosis not present

## 2017-12-15 DIAGNOSIS — Z23 Encounter for immunization: Secondary | ICD-10-CM | POA: Diagnosis not present

## 2017-12-15 DIAGNOSIS — K219 Gastro-esophageal reflux disease without esophagitis: Secondary | ICD-10-CM | POA: Diagnosis not present

## 2017-12-15 DIAGNOSIS — Z0001 Encounter for general adult medical examination with abnormal findings: Secondary | ICD-10-CM | POA: Diagnosis not present

## 2017-12-15 DIAGNOSIS — R202 Paresthesia of skin: Secondary | ICD-10-CM | POA: Diagnosis not present

## 2017-12-15 DIAGNOSIS — J45909 Unspecified asthma, uncomplicated: Secondary | ICD-10-CM | POA: Diagnosis not present

## 2018-02-19 DIAGNOSIS — L308 Other specified dermatitis: Secondary | ICD-10-CM | POA: Diagnosis not present

## 2018-02-25 DIAGNOSIS — F250 Schizoaffective disorder, bipolar type: Secondary | ICD-10-CM | POA: Diagnosis not present

## 2018-02-25 DIAGNOSIS — F4312 Post-traumatic stress disorder, chronic: Secondary | ICD-10-CM | POA: Diagnosis not present

## 2018-02-25 DIAGNOSIS — F411 Generalized anxiety disorder: Secondary | ICD-10-CM | POA: Diagnosis not present

## 2018-05-18 DIAGNOSIS — J45909 Unspecified asthma, uncomplicated: Secondary | ICD-10-CM | POA: Diagnosis not present

## 2018-05-18 DIAGNOSIS — R569 Unspecified convulsions: Secondary | ICD-10-CM | POA: Diagnosis not present

## 2018-05-18 DIAGNOSIS — Z720 Tobacco use: Secondary | ICD-10-CM | POA: Diagnosis not present

## 2018-05-18 DIAGNOSIS — Z01118 Encounter for examination of ears and hearing with other abnormal findings: Secondary | ICD-10-CM | POA: Diagnosis not present

## 2018-05-18 DIAGNOSIS — Z5181 Encounter for therapeutic drug level monitoring: Secondary | ICD-10-CM | POA: Diagnosis not present

## 2018-05-18 DIAGNOSIS — M79641 Pain in right hand: Secondary | ICD-10-CM | POA: Diagnosis not present

## 2018-05-18 DIAGNOSIS — Z01021 Encounter for examination of eyes and vision following failed vision screening with abnormal findings: Secondary | ICD-10-CM | POA: Diagnosis not present

## 2018-05-18 DIAGNOSIS — Z1329 Encounter for screening for other suspected endocrine disorder: Secondary | ICD-10-CM | POA: Diagnosis not present

## 2018-05-18 DIAGNOSIS — R7303 Prediabetes: Secondary | ICD-10-CM | POA: Diagnosis not present

## 2018-05-18 DIAGNOSIS — Z1389 Encounter for screening for other disorder: Secondary | ICD-10-CM | POA: Diagnosis not present

## 2018-05-18 DIAGNOSIS — Z136 Encounter for screening for cardiovascular disorders: Secondary | ICD-10-CM | POA: Diagnosis not present

## 2018-05-18 DIAGNOSIS — G629 Polyneuropathy, unspecified: Secondary | ICD-10-CM | POA: Diagnosis not present

## 2018-05-18 DIAGNOSIS — K219 Gastro-esophageal reflux disease without esophagitis: Secondary | ICD-10-CM | POA: Diagnosis not present

## 2018-05-18 DIAGNOSIS — E782 Mixed hyperlipidemia: Secondary | ICD-10-CM | POA: Diagnosis not present

## 2018-05-18 DIAGNOSIS — Z131 Encounter for screening for diabetes mellitus: Secondary | ICD-10-CM | POA: Diagnosis not present

## 2018-07-14 DIAGNOSIS — Z720 Tobacco use: Secondary | ICD-10-CM | POA: Diagnosis not present

## 2018-07-14 DIAGNOSIS — J45909 Unspecified asthma, uncomplicated: Secondary | ICD-10-CM | POA: Diagnosis not present

## 2018-07-14 DIAGNOSIS — R569 Unspecified convulsions: Secondary | ICD-10-CM | POA: Diagnosis not present

## 2018-07-14 DIAGNOSIS — K219 Gastro-esophageal reflux disease without esophagitis: Secondary | ICD-10-CM | POA: Diagnosis not present

## 2018-07-14 DIAGNOSIS — E782 Mixed hyperlipidemia: Secondary | ICD-10-CM | POA: Diagnosis not present

## 2018-08-17 DIAGNOSIS — Z720 Tobacco use: Secondary | ICD-10-CM | POA: Diagnosis not present

## 2018-08-17 DIAGNOSIS — J45909 Unspecified asthma, uncomplicated: Secondary | ICD-10-CM | POA: Diagnosis not present

## 2018-08-17 DIAGNOSIS — E782 Mixed hyperlipidemia: Secondary | ICD-10-CM | POA: Diagnosis not present

## 2018-08-17 DIAGNOSIS — Z0001 Encounter for general adult medical examination with abnormal findings: Secondary | ICD-10-CM | POA: Diagnosis not present

## 2018-08-17 DIAGNOSIS — R569 Unspecified convulsions: Secondary | ICD-10-CM | POA: Diagnosis not present

## 2018-11-08 DIAGNOSIS — J45909 Unspecified asthma, uncomplicated: Secondary | ICD-10-CM | POA: Diagnosis not present

## 2018-11-08 DIAGNOSIS — R569 Unspecified convulsions: Secondary | ICD-10-CM | POA: Diagnosis not present

## 2018-11-08 DIAGNOSIS — E782 Mixed hyperlipidemia: Secondary | ICD-10-CM | POA: Diagnosis not present

## 2018-11-08 DIAGNOSIS — Z720 Tobacco use: Secondary | ICD-10-CM | POA: Diagnosis not present

## 2019-03-14 DIAGNOSIS — R569 Unspecified convulsions: Secondary | ICD-10-CM | POA: Diagnosis not present

## 2019-03-14 DIAGNOSIS — R300 Dysuria: Secondary | ICD-10-CM | POA: Diagnosis not present

## 2019-03-14 DIAGNOSIS — E782 Mixed hyperlipidemia: Secondary | ICD-10-CM | POA: Diagnosis not present

## 2019-03-14 DIAGNOSIS — J45909 Unspecified asthma, uncomplicated: Secondary | ICD-10-CM | POA: Diagnosis not present

## 2019-06-09 ENCOUNTER — Other Ambulatory Visit: Payer: Self-pay

## 2019-06-09 ENCOUNTER — Ambulatory Visit: Payer: Medicare Other | Admitting: Physician Assistant

## 2019-06-09 ENCOUNTER — Other Ambulatory Visit (HOSPITAL_COMMUNITY)
Admission: RE | Admit: 2019-06-09 | Discharge: 2019-06-09 | Disposition: A | Discharge status: Home / Self Care | Payer: Medicare Other | Source: Ambulatory Visit | Attending: Critical Care Medicine | Admitting: Critical Care Medicine

## 2019-06-09 VITALS — BP 140/94 | Temp 98.7°F | Resp 18 | Ht 67.0 in

## 2019-06-09 DIAGNOSIS — R109 Unspecified abdominal pain: Secondary | ICD-10-CM | POA: Diagnosis not present

## 2019-06-09 DIAGNOSIS — N3001 Acute cystitis with hematuria: Secondary | ICD-10-CM | POA: Diagnosis not present

## 2019-06-09 DIAGNOSIS — A599 Trichomoniasis, unspecified: Secondary | ICD-10-CM

## 2019-06-09 DIAGNOSIS — Z9189 Other specified personal risk factors, not elsewhere classified: Secondary | ICD-10-CM

## 2019-06-09 DIAGNOSIS — Z1159 Encounter for screening for other viral diseases: Secondary | ICD-10-CM | POA: Diagnosis not present

## 2019-06-09 DIAGNOSIS — W57XXXA Bitten or stung by nonvenomous insect and other nonvenomous arthropods, initial encounter: Secondary | ICD-10-CM

## 2019-06-09 DIAGNOSIS — N912 Amenorrhea, unspecified: Secondary | ICD-10-CM

## 2019-06-09 DIAGNOSIS — Z113 Encounter for screening for infections with a predominantly sexual mode of transmission: Secondary | ICD-10-CM

## 2019-06-09 DIAGNOSIS — N760 Acute vaginitis: Secondary | ICD-10-CM

## 2019-06-09 DIAGNOSIS — B3731 Acute candidiasis of vulva and vagina: Secondary | ICD-10-CM

## 2019-06-09 LAB — POCT URINALYSIS DIP (CLINITEK)
Bilirubin, UA: NEGATIVE
Glucose, UA: NEGATIVE mg/dL
Ketones, POC UA: NEGATIVE mg/dL
Leukocytes, UA: NEGATIVE
Nitrite, UA: POSITIVE — AB
POC PROTEIN,UA: NEGATIVE
Spec Grav, UA: 1.02 (ref 1.010–1.025)
Urobilinogen, UA: 0.2 E.U./dL
pH, UA: 7.5 (ref 5.0–8.0)

## 2019-06-09 LAB — POCT URINE PREGNANCY: Preg Test, Ur: NEGATIVE

## 2019-06-09 MED ORDER — TRIAMCINOLONE ACETONIDE 0.1 % EX CREA: 1.0000 "application " | TOPICAL_CREAM | Freq: Two times a day (BID) | CUTANEOUS | 0 refills | Status: DC

## 2019-06-09 MED ORDER — SULFAMETHOXAZOLE-TRIMETHOPRIM 800-160 MG PO TABS: 1.0000 | ORAL_TABLET | Freq: Two times a day (BID) | ORAL | 0 refills | Status: AC

## 2019-06-09 NOTE — Progress Notes (Signed)
New Patient Office Visit  Subjective:  Patient ID: Kendra Roberts, female    DOB: 1975-08-13  Age: 44 y.o. MRN: [id number]  CC:  Chief Complaint  Patient presents with  . Exposure to STD    HPI Kendra Roberts reports that she has had suprapubic discomfort and pain for the past month.  Reports the pain is not improving, describes it as sharp, bilateral.  Reports she has also been feeling bloated.  Reports that she has not had a menses since February, has a new sexual partner, does endorse having a tubal ligation, but does want to become pregnant.  States that she has seen some blood in her urine and on the tissue.  Denies discharge, foul odor, itching or burning, denies frequency or urgency.  States that she has had increased nausea and vomiting in the morning, does take Nexium 40 mg on a daily basis Reports that she has tried tramadol without relief of her pain.  Does endorse significant history of kidney stones during one of her pregnancies that required emergency surgery.  Reports that she has been told that her room she is staying in at the [facility] shelter had bedbugs, states that she has noticed some bites on her arms, abdomen, and lower legs.  Has not used anything for relief, does endorse pruritus   Past Medical History:  Diagnosis Date  . Arthritis   . Asthma   . Kidney stones   . Seizures ([place])    One seizure years ago    Past Surgical History:  Procedure Laterality Date  . KIDNEY STONE SURGERY    . TUBAL LIGATION      History reviewed. No pertinent family history.  Social History   Socioeconomic History  . Marital status: Single    Spouse name: Not on file  . Number of children: Not on file  . Years of education: Not on file  . Highest education level: Not on file  Occupational History  . Not on file  Tobacco Use  . Smoking status: Current Some Day Smoker  . Smokeless tobacco: Never Used  Substance and Sexual Activity  . Alcohol  use: No  . Drug use: No  . Sexual activity: Yes    Birth control/protection: Surgical  Other Topics Concern  . Not on file  Social History Narrative  . Not on file   Social Determinants of Health   Financial Resource Strain:   . Difficulty of Paying Living Expenses:   Food Insecurity:   . Worried About Charity fundraiser in the Last Year:   . Arboriculturist in the Last Year:   Transportation Needs:   . Film/video editor (Medical):   Marland Kitchen Lack of Transportation (Non-Medical):   Physical Activity:   . Days of Exercise per Week:   . Minutes of Exercise per Session:   Stress:   . Feeling of Stress :   Social Connections:   . Frequency of Communication with Friends and Family:   . Frequency of Social Gatherings with Friends and Family:   . Attends Religious Services:   . Active Member of Clubs or Organizations:   . Attends Archivist Meetings:   Marland Kitchen Marital Status:   Intimate Partner Violence:   . Fear of Current or Ex-Partner:   . Emotionally Abused:   Marland Kitchen Physically Abused:   . Sexually Abused:     ROS Review of Systems  Constitutional: Negative for chills, fatigue and fever.  HENT: Negative.   Eyes: Negative.   Respiratory: Negative.   Cardiovascular: Negative.   Gastrointestinal: Positive for abdominal pain, nausea and vomiting.  Endocrine: Negative.   Genitourinary: Negative.   Musculoskeletal: Negative.   Skin: Negative.   Allergic/Immunologic: Negative.   Neurological: Negative.   Hematological: Negative.   Psychiatric/Behavioral: Negative.     Objective:   Today's Vitals: BP (!) 140/94 (BP Location: Left Arm, Patient Position: Sitting, Cuff Size: Large)   Temp 98.7 F (37.1 C) (Oral)   Resp 18   Ht 5' 7"  (1.702 m)   LMP 03/14/2019   BMI 33.83 kg/m   Physical Exam Vitals and nursing note reviewed.  Constitutional:      General: She is not in acute distress.    Appearance: Normal appearance. She is obese. She is not ill-appearing.   HENT:     Head: Normocephalic and atraumatic.     Right Ear: External ear normal.     Left Ear: External ear normal.     Nose: Nose normal.     Mouth/Throat:     Mouth: Mucous membranes are moist.     Pharynx: Oropharynx is clear.  Eyes:     Extraocular Movements: Extraocular movements intact.     Conjunctiva/sclera: Conjunctivae normal.     Pupils: Pupils are equal, round, and reactive to light.  Cardiovascular:     Rate and Rhythm: Normal rate and regular rhythm.     Pulses: Normal pulses.     Heart sounds: Normal heart sounds.  Pulmonary:     Effort: Pulmonary effort is normal.     Breath sounds: Normal breath sounds.  Abdominal:     General: Abdomen is flat. Bowel sounds are normal.     Palpations: Abdomen is soft.     Tenderness: There is abdominal tenderness. There is right CVA tenderness, left CVA tenderness and guarding.     Comments: Suprapubic tenderness  Musculoskeletal:        General: Normal range of motion.     Cervical back: Normal range of motion and neck supple.  Skin:    General: Skin is warm and dry.     Comments: Scattered generalized lesions on abdomen, both arms and both lower legs in different stages of healing, non-pustular, no erythema noted  Neurological:     General: No focal deficit present.     Mental Status: She is alert and oriented to person, place, and time.  Psychiatric:        Mood and Affect: Mood normal.        Behavior: Behavior normal.        Thought Content: Thought content normal.        Judgment: Judgment normal.     Assessment & Plan:   Problem List Items Addressed This Visit    None    Visit Diagnoses    Acute cystitis with hematuria    -  Primary   Relevant Medications   sulfamethoxazole-trimethoprim (BACTRIM DS) 800-160 MG tablet   Other Relevant Orders   Urine Culture   Abdominal pain, unspecified abdominal location       Relevant Orders   Urine cytology ancillary only   Cervicovaginal ancillary only   Amenorrhea        Relevant Orders   POCT urine pregnancy (Completed)   Multiple insect bites       Relevant Medications   triamcinolone cream (KENALOG) 0.1 %   Routine screening for STI (sexually transmitted infection)  Relevant Orders   RPR   HIV antibody (with reflex)   HSV Type I/II IgG, IgMw/ reflex   Encounter for HCV screening test for high risk patient       Relevant Orders   Hepatitis c antibody (reflex)      Outpatient Encounter Medications as of 06/09/2019  Medication Sig  . albuterol (PROVENTIL HFA;VENTOLIN HFA) 108 (90 BASE) MCG/ACT inhaler Inhale 2 puffs into the lungs every 6 (six) hours as needed for wheezing.  Marland Kitchen albuterol (PROVENTIL) (2.5 MG/3ML) 0.083% nebulizer solution Take 2.5 mg by nebulization every 6 (six) hours as needed for wheezing or shortness of breath.  . esomeprazole (NEXIUM) 40 MG capsule Take 40 mg by mouth daily before breakfast.  . hydrOXYzine (ATARAX/VISTARIL) 50 MG tablet Take 50 mg by mouth 3 (three) times daily as needed for itching or anxiety.  Marland Kitchen ibuprofen (ADVIL,MOTRIN) 800 MG tablet Take 1 tablet (800 mg total) by mouth 3 (three) times daily.  Marland Kitchen lamoTRIgine (LAMICTAL) 25 MG tablet Take 25 mg by mouth 2 (two) times daily.  . pantoprazole (PROTONIX) 40 MG tablet Take 40 mg by mouth daily.  . QUEtiapine (SEROQUEL) 100 MG tablet Take 100 mg by mouth at bedtime.  Grant Ruts INHUB 250-50 MCG/DOSE AEPB 1 puff 2 (two) times daily.  . citalopram (CELEXA) 20 MG tablet Take 20 mg by mouth daily.  . cyclobenzaprine (FLEXERIL) 10 MG tablet Take 1 tablet (10 mg total) by mouth 2 (two) times daily as needed for muscle spasms. (Patient not taking: Reported on 04/21/2017)  . gabapentin (NEURONTIN) 400 MG capsule Take 400 mg by mouth 3 (three) times daily.  Marland Kitchen HYDROcodone-acetaminophen (NORCO/VICODIN) 5-325 MG per tablet Take 1 tablet by mouth every 4 (four) hours as needed. (Patient not taking: Reported on 04/21/2017)  . hydrOXYzine (VISTARIL) 25 MG capsule Take 25 mg by  mouth at bedtime.  . naproxen (NAPROSYN) 500 MG tablet Take 1 tablet (500 mg total) by mouth 2 (two) times daily. (Patient not taking: Reported on 04/21/2017)  . sulfamethoxazole-trimethoprim (BACTRIM DS) 800-160 MG tablet Take 1 tablet by mouth 2 (two) times daily for 5 days.  . traMADol (ULTRAM) 50 MG tablet Take 1 tablet (50 mg total) by mouth every 6 (six) hours as needed. (Patient not taking: Reported on 06/09/2019)  . triamcinolone cream (KENALOG) 0.1 % Apply 1 application topically 2 (two) times daily.  . [DISCONTINUED] ciprofloxacin (CIPRO) 500 MG tablet Take 1 tablet (500 mg total) by mouth 2 (two) times daily.  . [DISCONTINUED] ondansetron (ZOFRAN ODT) 8 MG disintegrating tablet Take 1 tablet (8 mg total) by mouth every 8 (eight) hours as needed for nausea or vomiting. (Patient not taking: Reported on 06/09/2019)  . [DISCONTINUED] predniSONE (DELTASONE) 10 MG tablet Take 1 tablet (10 mg total) by mouth daily. (Patient not taking: Reported on 07/04/2014)   No facility-administered encounter medications on file as of 06/09/2019.   1. Acute cystitis with hematuria UA was positive for nitrites, trace of blood, positive bilateral CVA tenderness  We will treat for urinary tract infection, rule out STIs.  Encouraged increase hydration, red flag warnings given for prompt evaluation at emergency department  - sulfamethoxazole-trimethoprim (BACTRIM DS) 800-160 MG tablet; Take 1 tablet by mouth 2 (two) times daily for 5 days.  Dispense: 10 tablet; Refill: 0 - Urine Culture  2. Abdominal pain, unspecified abdominal location  - Urine cytology ancillary only - Cervicovaginal ancillary only  3. Amenorrhea Negative pregnancy test - POCT urine pregnancy  4. Multiple insect bites  Gave patient education on resolution of bedbugs, trial triamcinolone for relief of pruritus as needed - triamcinolone cream (KENALOG) 0.1 %; Apply 1 application topically 2 (two) times daily.  Dispense: 30 g; Refill: 0  5.  Routine screening for STI (sexually transmitted infection)  - RPR - HIV antibody (with reflex) - HSV Type I/II IgG, IgMw/ reflex  6. Encounter for HCV screening test for high risk patient  - Hepatitis c antibody (reflex)  Patient does have primary care provider, patient missed last appointment due to car difficulties, encourage patient to schedule follow-up, patient understands and agrees.  Patient to return to clinic next week for follow-up of urinary tract infection and abdominal pain.  I have reviewed the patient's medical history (PMH, PSH, Social History, Family History, Medications, and allergies) , and have been updated if relevant. I spent 30 minutes reviewing chart and  face to face time with patient.     Follow-up: Return in about 1 week (around 06/16/2019).   Loraine Grip Mayers, PA-C

## 2019-06-09 NOTE — Patient Instructions (Addendum)
I am treating you for a urinary tract infection, you will take Bactrim twice a day for 5 days, I encourage you to increase your water intake to help flush the infection out of your kidneys.  We are sending off your samples to test for sexually transmitted diseases, we will notify you as soon as the results are available.  I sent over some cortisone cream for you to put on your bug bites And I have enclosed information about bedbugs and how to resolve the issue.  Thank you for allowing [place] to take care of you today, we will see you in 1 week.  Kennieth Rad, PA-C Physician Assistant [facility] [email or web address]   Pyelonephritis, Adult Pyelonephritis is an infection that occurs in the kidney. The kidneys are the organs that filter a person's blood and move waste out of the bloodstream and into the urine. Urine passes from the kidneys, through tubes called ureters, and into the bladder. There are two main types of pyelonephritis:  Infections that come on quickly without any warning (acute pyelonephritis).  Infections that last for a long period of time (chronic pyelonephritis). In most cases, the infection clears up with treatment and does not cause further problems. More severe infections or chronic infections can sometimes spread to the bloodstream or lead to other problems with the kidneys. What are the causes? This condition is usually caused by:  Bacteria traveling from the bladder up to the kidney. This may occur after having a bladder infection (cystitis) or urinary tract infection (UTI).  Bladder infections caused from bacteria traveling from the bloodstream to the kidney. What increases the risk? This condition is more likely to develop in:  Pregnant women.  Older people.  People who have any of these conditions: ? Diabetes. ? Inflammation of the prostate gland (prostatitis), in males. ? Kidney stones or bladder  stones. ? Other abnormalities of the kidney or ureter. ? Cancer.  People who have a catheter placed in the bladder.  People who are sexually active.  Women who use spermicides.  People who have had a prior UTI. What are the signs or symptoms? Symptoms of this condition include:  Frequent urination.  Strong or persistent urge to urinate.  Burning or stinging when urinating.  Abdominal pain.  Back pain.  Pain in the side or flank area.  Fever or chills.  Blood in the urine, or dark urine.  Nausea or vomiting. How is this diagnosed? This condition may be diagnosed based on:  Your medical history and a physical exam.  Urine tests.  Blood tests. You may also have imaging tests of the kidneys, such as an ultrasound or CT scan. How is this treated? Treatment for this condition may depend on the severity of the infection.  If the infection is mild and is found early, you may be treated with antibiotic medicines taken by mouth (orally). You will need to drink fluids to remain hydrated.  If the infection is more severe, you may need to stay in the hospital and receive antibiotics given directly into a vein through an IV. You may also need to receive fluids through an IV if you are not able to remain hydrated. After your hospital stay, you may need to take oral antibiotics for a period of time. Other treatments may be required, depending on the cause of the infection. Follow these instructions at home: Medicines  Take your antibiotic medicine as told by your health care provider. Do not stop taking the  antibiotic even if you start to feel better.  Take over-the-counter and prescription medicines only as told by your health care provider. General instructions   Drink enough fluid to keep your urine pale yellow.  Avoid caffeine, tea, and carbonated beverages. They tend to irritate the bladder.  Urinate often. Avoid holding in urine for long periods of time.  Urinate  before and after sex.  After a bowel movement, women should cleanse from front to back. Use each tissue only once.  Keep all follow-up visits as told by your health care provider. This is important. Contact a health care provider if:  Your symptoms do not get better after 2 days of treatment.  Your symptoms get worse.  You have a fever. Get help right away if you:  Are unable to take your antibiotics or fluids.  Have shaking chills.  Vomit.  Have severe flank or back pain.  Have extreme weakness or fainting. Summary  Pyelonephritis is a urinary tract infection (UTI) that occurs in the kidney.  Treatment for this condition may depend on the severity of the infection.  Take your antibiotic medicine as told by your health care provider. Do not stop taking the antibiotic even if you start to feel better.  Drink enough fluid to keep your urine pale yellow.  Keep all follow-up visits as told by your health care provider. This is important. This information is not intended to replace advice given to you by your health care provider. Make sure you discuss any questions you have with your health care provider. Document Revised: 11/17/2017 Document Reviewed: 11/17/2017 Elsevier Patient Education  Elida are tiny bugs that live in and around beds. They stay hidden during the day, and they come out at night and bite. Bedbugs need blood to live and grow. Where are bedbugs found? Bedbugs can be found anywhere, whether a place is clean or dirty. They are found in places where many people come and go, such as hotels, shelters, dorms, and health care settings. It is also common for them to be found in homes where there are many birds or bats nearby. What are bedbug bites like?  A bedbug bite leaves a small red bump with a darker red dot in the middle. The bump may appear soon after a person is bitten or one or more days later. Bedbug bites usually do not  hurt, but they may itch. Most people do not need treatment for bedbug bites. The bumps usually go away on their own in a few days. If you have a lot of bedbug bites and they feel very itchy:  Do not scratch the bite areas.  You may apply any of these to the bite area as told by your health care provider: ? A baking soda paste. Make this by adding water to baking soda. ? Cortisone cream. ? Calamine lotion. How do I check for bedbugs? Adult bedbugs are reddish-brown, oval, and flat. They are about as long as a grain of rice ( inch or 5-7 mm), and they cannot fly. Young bedbugs (nymphs) are smaller, and they are whitish-yellow or clear (translucent). Using a flashlight, look for bedbugs in these places:  On mattresses, bed frames, headboards, and box springs.  On drapes and curtains in bedrooms.  Under carpeting in bedrooms.  Behind electrical outlets.  Behind any wallpaper that is peeling.  Inside luggage. Also look for black or red spots or stains on or near the bed. Stains  can come from bedbugs that have been crushed or from bedbug waste. What should I do if I find bedbugs? When traveling If you find bedbugs while traveling, check all of your possessions carefully before you bring them into your home. Consider throwing away anything that has bedbugs on it. At home If you find bedbugs at home, your bedroom may need to be treated by a pest control expert. You may also need to throw away mattresses or luggage. To help prevent bedbugs from coming back, consider taking these actions:  Wash your clothes and bedding in water that is hotter than 120F (48.9C) and dry them on a hot setting. Bedbugs are killed by high temperatures.  Put a plastic cover over your mattress.  When sleeping, wear pajamas that have long sleeves and pant legs. Bedbugs usually bite areas of the skin that are not covered.  Vacuum often around the bed and in all of the cracks and crevices where the bugs might  hide.  Carefully check all used furniture, bedding, or clothes that you bring into your home.  Eliminate bird nests and bat roosts that are near your home. Where to find more information  [employer], operational (EPA): [email or web address] Summary  Bedbugs are tiny bugs that live in and around beds.  Bedbugs are most often found in places where many people come and go, such as hotels, shelters, dorms, and health care settings.  A bedbug bite leaves a small red bump with a darker red dot in the middle.  Bedbug bites usually do not hurt, but they may itch.  If you find bedbugs at home, your bedroom may need to be treated by a pest control expert. This information is not intended to replace advice given to you by your health care provider. Make sure you discuss any questions you have with your health care provider. Document Revised: 12/26/2016 Document Reviewed: 09/05/2016 Elsevier Patient Education  2020 [employer].

## 2019-06-10 LAB — CERVICOVAGINAL ANCILLARY ONLY
Bacterial Vaginitis (gardnerella): POSITIVE — AB
Candida Glabrata: POSITIVE — AB
Candida Vaginitis: NEGATIVE
Comment: NEGATIVE
Comment: NEGATIVE
Comment: NEGATIVE

## 2019-06-10 LAB — HSV TYPE I/II IGG, IGMW/ REFLEX
HSV 1 Glycoprotein G Ab, IgG: 33.4 index — ABNORMAL HIGH (ref 0.00–0.90)
HSV 1 IgM: <1:10 {titer}
HSV 2 IgG, Type Spec: 4.82 index — ABNORMAL HIGH (ref 0.00–0.90)
HSV 2 IgM: <1:10 {titer}

## 2019-06-10 LAB — URINE CYTOLOGY ANCILLARY ONLY
Chlamydia: NEGATIVE
Comment: NEGATIVE
Comment: NEGATIVE
Comment: NORMAL
Neisseria Gonorrhea: NEGATIVE
Trichomonas: POSITIVE — AB

## 2019-06-10 LAB — HEPATITIS C ANTIBODY (REFLEX): HCV Ab: <0.1 s/co ratio (ref 0.0–0.9)

## 2019-06-10 LAB — HSV-2 IGG SUPPLEMENTAL TEST: HSV-2 IgG Supplemental Test: POSITIVE — AB

## 2019-06-10 LAB — HCV COMMENT:

## 2019-06-10 LAB — HIV ANTIBODY (ROUTINE TESTING W REFLEX): HIV Screen 4th Generation wRfx: NONREACTIVE

## 2019-06-10 LAB — RPR: RPR Ser Ql: NONREACTIVE

## 2019-06-13 LAB — URINE CULTURE

## 2019-06-13 MED ORDER — FLUCONAZOLE 100 MG PO TABS: 100.0000 mg | ORAL_TABLET | Freq: Every day | ORAL | 0 refills | Status: DC

## 2019-06-13 MED ORDER — METRONIDAZOLE 500 MG PO TABS: 500.0000 mg | ORAL_TABLET | Freq: Two times a day (BID) | ORAL | 0 refills | Status: AC

## 2019-06-13 NOTE — Addendum Note (Signed)
Addended by: Kennieth Rad on: 06/13/2019 09:12 AM   Modules accepted: Orders

## 2019-06-14 ENCOUNTER — Telehealth: Payer: Self-pay | Admitting: *Deleted

## 2019-06-14 NOTE — Telephone Encounter (Signed)
-----   Message from Kennieth Rad, [place] sent at 06/13/2019  9:08 AM EDT ----- Please call patient and let her know that her test results did return, she was positive for trichomonas, bacterial vaginitis, and a yeast infection.  I am sending 2 more medications to her pharmacy that she needs to take, she will take metronidazole 500 mg twice a day for 7 days and she will take 1 dose of Diflucan.She is due to see [place] [facility] at [facility] on [facility], I encourage her to return to the unit so we can discuss the rest of her lab results at that time.She will need to be retested 2 weeks after finishing the metronidazole to make sure that the trichomonas infection has resolved.  It is imperative that she abstain from intercourse until it is known that the trichomonas infection has resolved.  She should inform all of her partners because they need to be treated and tested as well.Thank you

## 2019-06-14 NOTE — Telephone Encounter (Signed)
Patient verified DOB Patient is aware of [place], BV, and yeast being present and needing to complete the treatment and pick up the additional 2 medications. Patient aware of completing antibiotics and then taking diflucan for yeast. Patient encouraged to report to [facility] this Thursday to discuss any other concerns and follow up. Patient aware of partner or partners needing to be tested and treated via [facility] or the [facility] Department.

## 2019-06-16 ENCOUNTER — Ambulatory Visit: Payer: Medicare Other

## 2019-08-11 ENCOUNTER — Emergency Department (HOSPITAL_COMMUNITY)
Admission: EM | Admit: 2019-08-11 | Discharge: 2019-08-11 | Disposition: A | Discharge status: Home / Self Care | Payer: Medicare Other | Attending: Emergency Medicine | Admitting: Emergency Medicine

## 2019-08-11 ENCOUNTER — Emergency Department (HOSPITAL_COMMUNITY): Payer: Medicare Other

## 2019-08-11 ENCOUNTER — Encounter (HOSPITAL_COMMUNITY): Payer: Self-pay | Admitting: Emergency Medicine

## 2019-08-11 ENCOUNTER — Other Ambulatory Visit: Payer: Self-pay

## 2019-08-11 DIAGNOSIS — R519 Headache, unspecified: Secondary | ICD-10-CM | POA: Insufficient documentation

## 2019-08-11 DIAGNOSIS — Z91040 Latex allergy status: Secondary | ICD-10-CM | POA: Diagnosis not present

## 2019-08-11 DIAGNOSIS — R531 Weakness: Secondary | ICD-10-CM | POA: Diagnosis not present

## 2019-08-11 DIAGNOSIS — Z6838 Body mass index (BMI) 38.0-38.9, adult: Secondary | ICD-10-CM | POA: Insufficient documentation

## 2019-08-11 DIAGNOSIS — M7918 Myalgia, other site: Secondary | ICD-10-CM | POA: Insufficient documentation

## 2019-08-11 DIAGNOSIS — G629 Polyneuropathy, unspecified: Secondary | ICD-10-CM | POA: Diagnosis not present

## 2019-08-11 DIAGNOSIS — Z880 Allergy status to penicillin: Secondary | ICD-10-CM | POA: Diagnosis not present

## 2019-08-11 DIAGNOSIS — J45909 Unspecified asthma, uncomplicated: Secondary | ICD-10-CM | POA: Diagnosis not present

## 2019-08-11 DIAGNOSIS — E669 Obesity, unspecified: Secondary | ICD-10-CM | POA: Diagnosis not present

## 2019-08-11 DIAGNOSIS — M542 Cervicalgia: Secondary | ICD-10-CM | POA: Diagnosis not present

## 2019-08-11 DIAGNOSIS — M5432 Sciatica, left side: Secondary | ICD-10-CM | POA: Diagnosis not present

## 2019-08-11 DIAGNOSIS — F17200 Nicotine dependence, unspecified, uncomplicated: Secondary | ICD-10-CM | POA: Diagnosis not present

## 2019-08-11 DIAGNOSIS — Z79899 Other long term (current) drug therapy: Secondary | ICD-10-CM | POA: Insufficient documentation

## 2019-08-11 DIAGNOSIS — M5442 Lumbago with sciatica, left side: Secondary | ICD-10-CM | POA: Diagnosis not present

## 2019-08-11 DIAGNOSIS — K219 Gastro-esophageal reflux disease without esophagitis: Secondary | ICD-10-CM | POA: Insufficient documentation

## 2019-08-11 DIAGNOSIS — M79605 Pain in left leg: Secondary | ICD-10-CM | POA: Diagnosis not present

## 2019-08-11 DIAGNOSIS — M5441 Lumbago with sciatica, right side: Secondary | ICD-10-CM | POA: Diagnosis not present

## 2019-08-11 DIAGNOSIS — M5431 Sciatica, right side: Secondary | ICD-10-CM | POA: Diagnosis not present

## 2019-08-11 DIAGNOSIS — R42 Dizziness and giddiness: Secondary | ICD-10-CM | POA: Insufficient documentation

## 2019-08-11 LAB — CBC
HCT: 32.1 % — ABNORMAL LOW (ref 36.0–46.0)
Hemoglobin: 10.2 g/dL — ABNORMAL LOW (ref 12.0–15.0)
MCH: 26.4 pg (ref 26.0–34.0)
MCHC: 31.8 g/dL (ref 30.0–36.0)
MCV: 82.9 fL (ref 80.0–100.0)
Platelets: 205 10*3/uL (ref 150–400)
RBC: 3.87 MIL/uL (ref 3.87–5.11)
RDW: 18 % — ABNORMAL HIGH (ref 11.5–15.5)
WBC: 7.7 10*3/uL (ref 4.0–10.5)
nRBC: 0 % (ref 0.0–0.2)

## 2019-08-11 LAB — APTT: aPTT: 28 seconds (ref 24–36)

## 2019-08-11 LAB — COMPREHENSIVE METABOLIC PANEL
ALT: 19 U/L (ref 0–44)
AST: 33 U/L (ref 15–41)
Albumin: 3.3 g/dL — ABNORMAL LOW (ref 3.5–5.0)
Alkaline Phosphatase: 59 U/L (ref 38–126)
Anion gap: 8 (ref 5–15)
BUN: 13 mg/dL (ref 6–20)
CO2: 27 mmol/L (ref 22–32)
Calcium: 9.2 mg/dL (ref 8.9–10.3)
Chloride: 104 mmol/L (ref 98–111)
Creatinine, Ser: 0.99 mg/dL (ref 0.44–1.00)
GFR calc Af Amer: >60 mL/min (ref >60)
GFR calc non Af Amer: >60 mL/min (ref >60)
Glucose, Bld: 96 mg/dL (ref 70–99)
Potassium: 4.1 mmol/L (ref 3.5–5.1)
Sodium: 139 mmol/L (ref 135–145)
Total Bilirubin: 0.1 mg/dL — ABNORMAL LOW (ref 0.3–1.2)
Total Protein: 6.9 g/dL (ref 6.5–8.1)

## 2019-08-11 LAB — DIFFERENTIAL
Abs Immature Granulocytes: 0.01 10*3/uL (ref 0.00–0.07)
Basophils Absolute: 0 10*3/uL (ref 0.0–0.1)
Basophils Relative: 0 %
Eosinophils Absolute: 0 10*3/uL (ref 0.0–0.5)
Eosinophils Relative: 0 %
Immature Granulocytes: 0 %
Lymphocytes Relative: 27 %
Lymphs Abs: 2.1 10*3/uL (ref 0.7–4.0)
Monocytes Absolute: 0.5 10*3/uL (ref 0.1–1.0)
Monocytes Relative: 6 %
Neutro Abs: 5.1 10*3/uL (ref 1.7–7.7)
Neutrophils Relative %: 67 %

## 2019-08-11 LAB — CK: Total CK: 298 U/L — ABNORMAL HIGH (ref 38–234)

## 2019-08-11 LAB — I-STAT BETA HCG BLOOD, ED (MC, WL, AP ONLY): I-stat hCG, quantitative: <5 m[IU]/mL (ref <5)

## 2019-08-11 LAB — PROTIME-INR
INR: 0.9 (ref 0.8–1.2)
Prothrombin Time: 12 seconds (ref 11.4–15.2)

## 2019-08-11 MED ORDER — METHYLPREDNISOLONE 4 MG PO TBPK
ORAL_TABLET | ORAL | 0 refills | Status: DC
Start: 2019-08-11 — End: 2021-02-01

## 2019-08-11 MED ORDER — METHOCARBAMOL 500 MG PO TABS
500.0000 mg | ORAL_TABLET | Freq: Two times a day (BID) | ORAL | 0 refills | Status: DC
Start: 2019-08-11 — End: 2021-02-01

## 2019-08-11 MED ORDER — KETOROLAC TROMETHAMINE 60 MG/2ML IM SOLN
30.0000 mg | Freq: Once | INTRAMUSCULAR | Status: AC
  Administered 2019-08-11: 30 mg via INTRAMUSCULAR
  Filled 2019-08-11: qty 2

## 2019-08-11 MED ORDER — SODIUM CHLORIDE 0.9% FLUSH: 3.0000 mL | Freq: Once | INTRAVENOUS | Status: DC

## 2019-08-11 NOTE — Discharge Instructions (Signed)
Please start Medrol-dose pack for pain and inflammation Take Robaxin as needed for muscle pain and spasms Please follow up with sports medicine and your family doctor

## 2019-08-11 NOTE — ED Triage Notes (Signed)
C/o R leg and arm numbness/weakness since yesterday.  States she was unable to walk yesterday and fell.  Reports bilateral arm pain and R leg pain from fall.  No arm drift.

## 2019-08-11 NOTE — ED Provider Notes (Signed)
[facility] EMERGENCY DEPARTMENT Provider Note   CSN: [id number] Arrival date & time: 08/11/19  1401     History Chief Complaint  Patient presents with  . Numbness  . Weakness    Kendra Roberts is a 44 y.o. female who presents with leg weakness and numbness. Pt states that on Friday evening she developed acute numbness and tingling of her right shin. The paresthesias then started to move up her leg and to her R arm. It then when across her chest to her left side and now it's in the left arm and leg as well. She also reports flushing and insomnia although she feels very tired and lightheaded at times like she is going to pass out. She states her vision is abnormal as well. When asked to describe her vision changes she states "you know when it's just not right". She states she has a headache and neck pain and back pain. She has chest pain which she attributes to acid reflux. She has SOB which she attributes to asthma. 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 She states she went to a [facility] facility in [place] last night and was told she had a stroke and needed to come here to be evaluated. I cannot see any record of this in EMR. She denies bowel or bladder incontinence.  HPI     Past Medical History:  Diagnosis Date  . Arthritis   . Asthma   . Kidney stones   . Seizures ([place])    One seizure years ago    Patient Active Problem List   Diagnosis Date Noted  . OBESITY 09/29/2007  . CONSTIPATION 09/29/2007    Past Surgical History:  Procedure Laterality Date  . KIDNEY STONE SURGERY    . TUBAL LIGATION       OB History    Gravida  6   Para  5   Term  5   Preterm      AB  1   Living  4     SAB  1   TAB      Ectopic      Multiple      Live Births              No family history on file.  Social History   Tobacco Use  . Smoking status: Current Some Day Smoker  . Smokeless tobacco: Never Used  Substance Use Topics  .  Alcohol use: No  . Drug use: No    Home Medications Prior to Admission medications   Medication Sig Start Date End Date Taking? Authorizing Provider  albuterol (PROVENTIL HFA;VENTOLIN HFA) 108 (90 BASE) MCG/ACT inhaler Inhale 2 puffs into the lungs every 6 (six) hours as needed for wheezing.   Yes [provider]  albuterol (PROVENTIL) (2.5 MG/3ML) 0.083% nebulizer solution Take 2.5 mg by nebulization every 6 (six) hours as needed for wheezing or shortness of breath.   Yes [provider]  cholecalciferol (VITAMIN D3) 25 MCG (1000 UNIT) tablet Take 1,000 Units by mouth daily.   Yes [provider]  citalopram (CELEXA) 20 MG tablet Take 20 mg by mouth daily. 05/13/19  Yes [provider]  cyclobenzaprine (FLEXERIL) 10 MG tablet Take 1 tablet (10 mg total) by mouth 2 (two) times daily as needed for muscle spasms. 10/05/14  Yes Neese, [place], NP  esomeprazole (NEXIUM) 40 MG capsule Take 40 mg by mouth daily before breakfast.   Yes [provider]  fluconazole (DIFLUCAN) 100 MG tablet Take 1 tablet (100 mg total) by mouth daily. Take two once then one daily until gone Patient taking differently: Take 100 mg by mouth daily.  06/13/19  Yes Mayers, Cari S, PA-C  gabapentin (NEURONTIN) 400 MG capsule Take 400 mg by mouth 3 (three) times daily. 04/17/19  Yes [provider]  HYDROcodone-acetaminophen (NORCO/VICODIN) 5-325 MG per tablet Take 1 tablet by mouth every 4 (four) hours as needed. 10/05/14  Yes Neese, Hope M, NP  hydrOXYzine (VISTARIL) 25 MG capsule Take 25 mg by mouth in the morning and at bedtime.  05/19/19  Yes [provider]  ibuprofen (ADVIL,MOTRIN) 800 MG tablet Take 1 tablet (800 mg total) by mouth 3 (three) times daily. 05/12/17  Yes Etta Quill, NP  montelukast (SINGULAIR) 10 MG tablet Take 10 mg by mouth daily.  06/10/19  Yes [provider]  Multiple Vitamin (MULTIVITAMIN WITH MINERALS) TABS tablet Take 1 tablet by mouth  daily.   Yes [provider]  pantoprazole (PROTONIX) 40 MG tablet Take 40 mg by mouth daily. 06/03/19  Yes [provider]  Grant Ruts INHUB 250-50 MCG/DOSE AEPB Inhale 1 puff into the lungs 2 (two) times daily.  04/19/19  Yes [provider]  lamoTRIgine (LAMICTAL) 25 MG tablet Take 25 mg by mouth 2 (two) times daily.    [provider]  naproxen (NAPROSYN) 500 MG tablet Take 1 tablet (500 mg total) by mouth 2 (two) times daily. Patient not taking: Reported on 04/21/2017 10/05/14   Ashley Murrain, NP  traMADol (ULTRAM) 50 MG tablet Take 1 tablet (50 mg total) by mouth every 6 (six) hours as needed. Patient not taking: Reported on 06/09/2019 04/21/17   Jeannett Senior, PA-C  triamcinolone cream (KENALOG) 0.1 % Apply 1 application topically 2 (two) times daily. Patient not taking: Reported on 08/11/2019 06/09/19   Mayers, Cari S, PA-C    Allergies    Latex and Penicillins  Review of Systems   Review of Systems  Constitutional: Negative for chills and fever.  Respiratory: Negative for shortness of breath.   Cardiovascular: Negative for chest pain.  Gastrointestinal: Negative for abdominal pain.  Musculoskeletal: Positive for arthralgias, back pain, myalgias and neck pain.  Neurological: Positive for dizziness, weakness and headaches.  All other systems reviewed and are negative.   Physical Exam Updated Vital Signs BP (!) 148/97 (BP Location: Right Arm)   Pulse 72   Temp 98.6 F (37 C) (Oral)   Resp 17   Ht 5' 2"  (1.575 m)   Wt 96.2 kg   SpO2 100%   BMI 38.78 kg/m   Physical Exam Vitals and nursing note reviewed.  Constitutional:      General: She is not in acute distress.    Appearance: She is well-developed. She is obese. She is not ill-appearing.     Comments: Calm, cooperative. NAD. Odd, inappropriately elated affect  HENT:     Head: Normocephalic and atraumatic.  Eyes:     General: No scleral icterus.       Right eye: No discharge.         Left eye: No discharge.     Conjunctiva/sclera: Conjunctivae normal.     Pupils: Pupils are equal, round, and reactive to light.  Cardiovascular:     Rate and Rhythm: Normal rate and regular rhythm.  Pulmonary:     Effort: Pulmonary effort is normal. No respiratory distress.     Breath sounds: Normal breath sounds.  Abdominal:     General: There is no distension.  Musculoskeletal:     Cervical back: Normal range of motion.  Skin:    General: Skin is warm and dry.  Neurological:     Mental Status: She is alert and oriented to person, place, and time.     Comments: Mental Status:  Alert, oriented, thought content appropriate, able to give a coherent history. Speech fluent without evidence of aphasia. Able to follow 2 step commands without difficulty.  Cranial Nerves:  II:  Peripheral visual fields grossly normal, pupils equal, round, reactive to light III,IV, VI: ptosis not present, extra-ocular motions intact bilaterally  V,VII: smile symmetric, facial light touch sensation equal VIII: hearing grossly normal to voice  X: uvula elevates symmetrically  XI: bilateral shoulder shrug symmetric and strong XII: midline tongue extension without fassiculations Motor:  Normal tone. 5/5 in upper and lower extremities bilaterally including strong and equal grip strength and dorsiflexion/plantar flexion Sensory: Pt reports subjective numbness on the R side, especially on the R shin Cerebellar: normal finger-to-nose with bilateral upper extremities Gait: not tested CV: distal pulses palpable throughout    Psychiatric:        Behavior: Behavior normal.     ED Results / Procedures / Treatments   Labs (all labs ordered are listed, but only abnormal results are displayed) Labs Reviewed  CBC - Abnormal; Notable for the following components:      Result Value   Hemoglobin 10.2 (*)    HCT 32.1 (*)    RDW 18.0 (*)    All other components within normal limits  COMPREHENSIVE METABOLIC PANEL -  Abnormal; Notable for the following components:   Albumin 3.3 (*)    Total Bilirubin 0.1 (*)    All other components within normal limits  CK - Abnormal; Notable for the following components:   Total CK 298 (*)    All other components within normal limits  PROTIME-INR  APTT  DIFFERENTIAL  I-STAT BETA HCG BLOOD, ED ([facility], [facility], [facility] ONLY)    EKG None  Radiology CT HEAD WO CONTRAST  Result Date: 08/11/2019 CLINICAL DATA:  Altered mental status. EXAM: CT HEAD WITHOUT CONTRAST TECHNIQUE: Contiguous axial images were obtained from the base of the skull through the vertex without intravenous contrast. COMPARISON:  11/27/2005 head CT. FINDINGS: Brain: No acute infarct or intracranial hemorrhage. No mass lesion. No midline shift, ventriculomegaly or extra-axial fluid collection. Vascular: No hyperdense vessel or unexpected calcification. Skull: Negative for fracture or focal lesion. Sinuses/Orbits: Normal orbits. Clear paranasal sinuses. No mastoid effusion. Other: None. IMPRESSION: No acute intracranial process. Electronically Signed   By: Primitivo Gauze M.D.   On: 08/11/2019 17:45    Procedures Procedures (including critical care time)  Medications Ordered in ED Medications  sodium chloride flush (NS) 0.9 % injection 3 mL (has no administration in time range)  ketorolac (TORADOL) injection 30 mg (has no administration in time range)    ED Course  I have reviewed the triage vital signs and the nursing notes.  Pertinent labs & imaging results that were available during my care of the patient were reviewed by me and considered in my medical decision making (see chart for details).  44 year old female presents with bilateral leg pain and weakness as well as numbness/tingling of the bilateral arms. She is hypertensive but otherwise vitals are normal. She has an inconsistent history and exam. EKG is NSR. She does not have any appreciable weakness on her exam. She does  have pain with ROM of her  arms and legs. She does report decreased sensation of the right leg but also has tingling on the left side. Shared visit with Dr. Ron Parker. Pt gives a very inconsistent history and exam. She initially states she had a stroke. When it was explained that this was unlikely given her bilateral symptoms and that symptoms were pain related she states that it might be her sciatica that is flaring up. Labs and head CT obtained are normal. Will treat for possible sciatica with IM Toradol. She was given rx for Robaxin and steroid taper and referral to sports medicine and advised to f/u with PCP.  MDM Rules/Calculators/A&P                           Final Clinical Impression(s) / ED Diagnoses Final diagnoses:  Bilateral sciatica    Rx / DC Orders ED Discharge Orders    None       Recardo Evangelist, PA-C 08/12/19 1041    Breck Coons, MD 08/12/19 [phone]

## 2019-09-29 DIAGNOSIS — E782 Mixed hyperlipidemia: Secondary | ICD-10-CM | POA: Diagnosis not present

## 2019-09-29 DIAGNOSIS — R569 Unspecified convulsions: Secondary | ICD-10-CM | POA: Diagnosis not present

## 2019-09-29 DIAGNOSIS — J45909 Unspecified asthma, uncomplicated: Secondary | ICD-10-CM | POA: Diagnosis not present

## 2020-02-29 DIAGNOSIS — R569 Unspecified convulsions: Secondary | ICD-10-CM | POA: Diagnosis not present

## 2020-02-29 DIAGNOSIS — J45909 Unspecified asthma, uncomplicated: Secondary | ICD-10-CM | POA: Diagnosis not present

## 2020-02-29 DIAGNOSIS — J302 Other seasonal allergic rhinitis: Secondary | ICD-10-CM | POA: Diagnosis not present

## 2020-02-29 DIAGNOSIS — E782 Mixed hyperlipidemia: Secondary | ICD-10-CM | POA: Diagnosis not present

## 2020-02-29 DIAGNOSIS — G629 Polyneuropathy, unspecified: Secondary | ICD-10-CM | POA: Diagnosis not present

## 2020-02-29 DIAGNOSIS — K219 Gastro-esophageal reflux disease without esophagitis: Secondary | ICD-10-CM | POA: Diagnosis not present

## 2020-02-29 DIAGNOSIS — R7303 Prediabetes: Secondary | ICD-10-CM | POA: Diagnosis not present

## 2020-02-29 DIAGNOSIS — Z720 Tobacco use: Secondary | ICD-10-CM | POA: Diagnosis not present

## 2020-10-12 DIAGNOSIS — R569 Unspecified convulsions: Secondary | ICD-10-CM | POA: Diagnosis not present

## 2020-10-12 DIAGNOSIS — J45909 Unspecified asthma, uncomplicated: Secondary | ICD-10-CM | POA: Diagnosis not present

## 2020-10-12 DIAGNOSIS — E782 Mixed hyperlipidemia: Secondary | ICD-10-CM | POA: Diagnosis not present

## 2020-10-12 DIAGNOSIS — R7303 Prediabetes: Secondary | ICD-10-CM | POA: Diagnosis not present

## 2020-10-12 DIAGNOSIS — G629 Polyneuropathy, unspecified: Secondary | ICD-10-CM | POA: Diagnosis not present

## 2020-10-12 DIAGNOSIS — M25512 Pain in left shoulder: Secondary | ICD-10-CM | POA: Diagnosis not present

## 2020-10-12 DIAGNOSIS — K219 Gastro-esophageal reflux disease without esophagitis: Secondary | ICD-10-CM | POA: Diagnosis not present

## 2020-10-12 DIAGNOSIS — Z720 Tobacco use: Secondary | ICD-10-CM | POA: Diagnosis not present

## 2020-10-12 DIAGNOSIS — R110 Nausea: Secondary | ICD-10-CM | POA: Diagnosis not present

## 2020-11-22 DIAGNOSIS — R569 Unspecified convulsions: Secondary | ICD-10-CM | POA: Diagnosis not present

## 2020-11-22 DIAGNOSIS — E782 Mixed hyperlipidemia: Secondary | ICD-10-CM | POA: Diagnosis not present

## 2020-11-22 DIAGNOSIS — M25532 Pain in left wrist: Secondary | ICD-10-CM | POA: Diagnosis not present

## 2020-11-22 DIAGNOSIS — R7303 Prediabetes: Secondary | ICD-10-CM | POA: Diagnosis not present

## 2020-11-22 DIAGNOSIS — D649 Anemia, unspecified: Secondary | ICD-10-CM | POA: Diagnosis not present

## 2020-11-22 DIAGNOSIS — G629 Polyneuropathy, unspecified: Secondary | ICD-10-CM | POA: Diagnosis not present

## 2020-11-22 DIAGNOSIS — Z720 Tobacco use: Secondary | ICD-10-CM | POA: Diagnosis not present

## 2020-11-22 DIAGNOSIS — J45909 Unspecified asthma, uncomplicated: Secondary | ICD-10-CM | POA: Diagnosis not present

## 2020-11-22 DIAGNOSIS — K219 Gastro-esophageal reflux disease without esophagitis: Secondary | ICD-10-CM | POA: Diagnosis not present

## 2021-01-02 DIAGNOSIS — R569 Unspecified convulsions: Secondary | ICD-10-CM | POA: Diagnosis not present

## 2021-01-02 DIAGNOSIS — G629 Polyneuropathy, unspecified: Secondary | ICD-10-CM | POA: Diagnosis not present

## 2021-01-02 DIAGNOSIS — M25532 Pain in left wrist: Secondary | ICD-10-CM | POA: Diagnosis not present

## 2021-01-02 DIAGNOSIS — R7303 Prediabetes: Secondary | ICD-10-CM | POA: Diagnosis not present

## 2021-01-02 DIAGNOSIS — D649 Anemia, unspecified: Secondary | ICD-10-CM | POA: Diagnosis not present

## 2021-01-02 DIAGNOSIS — Z0000 Encounter for general adult medical examination without abnormal findings: Secondary | ICD-10-CM | POA: Diagnosis not present

## 2021-01-02 DIAGNOSIS — K219 Gastro-esophageal reflux disease without esophagitis: Secondary | ICD-10-CM | POA: Diagnosis not present

## 2021-01-02 DIAGNOSIS — J45909 Unspecified asthma, uncomplicated: Secondary | ICD-10-CM | POA: Diagnosis not present

## 2021-01-02 DIAGNOSIS — Z720 Tobacco use: Secondary | ICD-10-CM | POA: Diagnosis not present

## 2021-01-02 DIAGNOSIS — E782 Mixed hyperlipidemia: Secondary | ICD-10-CM | POA: Diagnosis not present

## 2021-01-14 DIAGNOSIS — M654 Radial styloid tenosynovitis [de Quervain]: Secondary | ICD-10-CM | POA: Diagnosis not present

## 2021-01-31 ENCOUNTER — Other Ambulatory Visit: Payer: Self-pay

## 2021-01-31 ENCOUNTER — Emergency Department (HOSPITAL_COMMUNITY)
Admission: EM | Admit: 2021-01-31 | Discharge: 2021-02-01 | Disposition: A | Discharge status: Home / Self Care | Payer: Medicare Other | Attending: Emergency Medicine | Admitting: Emergency Medicine

## 2021-01-31 ENCOUNTER — Encounter (HOSPITAL_COMMUNITY): Payer: Self-pay

## 2021-01-31 DIAGNOSIS — K604 Rectal fistula: Secondary | ICD-10-CM | POA: Diagnosis not present

## 2021-01-31 DIAGNOSIS — K6289 Other specified diseases of anus and rectum: Secondary | ICD-10-CM | POA: Diagnosis not present

## 2021-01-31 DIAGNOSIS — Z91040 Latex allergy status: Secondary | ICD-10-CM | POA: Insufficient documentation

## 2021-01-31 DIAGNOSIS — L0231 Cutaneous abscess of buttock: Secondary | ICD-10-CM | POA: Insufficient documentation

## 2021-01-31 DIAGNOSIS — R590 Localized enlarged lymph nodes: Secondary | ICD-10-CM | POA: Diagnosis not present

## 2021-01-31 DIAGNOSIS — R2241 Localized swelling, mass and lump, right lower limb: Secondary | ICD-10-CM | POA: Diagnosis present

## 2021-01-31 DIAGNOSIS — L03317 Cellulitis of buttock: Secondary | ICD-10-CM | POA: Insufficient documentation

## 2021-01-31 DIAGNOSIS — Z79899 Other long term (current) drug therapy: Secondary | ICD-10-CM | POA: Insufficient documentation

## 2021-01-31 LAB — PROTIME-INR
INR: 1 (ref 0.8–1.2)
Prothrombin Time: 13.2 seconds (ref 11.4–15.2)

## 2021-01-31 LAB — COMPREHENSIVE METABOLIC PANEL
ALT: 13 U/L (ref 0–44)
AST: 23 U/L (ref 15–41)
Albumin: 3.6 g/dL (ref 3.5–5.0)
Alkaline Phosphatase: 72 U/L (ref 38–126)
Anion gap: 7 (ref 5–15)
BUN: 13 mg/dL (ref 6–20)
CO2: 23 mmol/L (ref 22–32)
Calcium: 8.9 mg/dL (ref 8.9–10.3)
Chloride: 101 mmol/L (ref 98–111)
Creatinine, Ser: 0.74 mg/dL (ref 0.44–1.00)
GFR, Estimated: >60 mL/min (ref >60)
Glucose, Bld: 105 mg/dL — ABNORMAL HIGH (ref 70–99)
Potassium: 3.5 mmol/L (ref 3.5–5.1)
Sodium: 131 mmol/L — ABNORMAL LOW (ref 135–145)
Total Bilirubin: 0.6 mg/dL (ref 0.3–1.2)
Total Protein: 8.3 g/dL — ABNORMAL HIGH (ref 6.5–8.1)

## 2021-01-31 LAB — CBC WITH DIFFERENTIAL/PLATELET
Abs Immature Granulocytes: 0.04 10*3/uL (ref 0.00–0.07)
Basophils Absolute: 0 10*3/uL (ref 0.0–0.1)
Basophils Relative: 0 %
Eosinophils Absolute: 0 10*3/uL (ref 0.0–0.5)
Eosinophils Relative: 0 %
HCT: 32 % — ABNORMAL LOW (ref 36.0–46.0)
Hemoglobin: 10.3 g/dL — ABNORMAL LOW (ref 12.0–15.0)
Immature Granulocytes: 0 %
Lymphocytes Relative: 11 %
Lymphs Abs: 1.4 10*3/uL (ref 0.7–4.0)
MCH: 25.1 pg — ABNORMAL LOW (ref 26.0–34.0)
MCHC: 32.2 g/dL (ref 30.0–36.0)
MCV: 77.9 fL — ABNORMAL LOW (ref 80.0–100.0)
Monocytes Absolute: 1.1 10*3/uL — ABNORMAL HIGH (ref 0.1–1.0)
Monocytes Relative: 8 %
Neutro Abs: 11 10*3/uL — ABNORMAL HIGH (ref 1.7–7.7)
Neutrophils Relative %: 81 %
Platelets: 198 10*3/uL (ref 150–400)
RBC: 4.11 MIL/uL (ref 3.87–5.11)
RDW: 18.1 % — ABNORMAL HIGH (ref 11.5–15.5)
WBC: 13.5 10*3/uL — ABNORMAL HIGH (ref 4.0–10.5)
nRBC: 0 % (ref 0.0–0.2)

## 2021-01-31 LAB — LACTIC ACID, PLASMA: Lactic Acid, Venous: 1.2 mmol/L (ref 0.5–1.9)

## 2021-01-31 LAB — I-STAT BETA HCG BLOOD, ED (MC, WL, AP ONLY): I-stat hCG, quantitative: <5 m[IU]/mL (ref <5)

## 2021-01-31 MED ORDER — HYDROCODONE-ACETAMINOPHEN 5-325 MG PO TABS
1.0000 | ORAL_TABLET | Freq: Once | ORAL | Status: DC
  Filled 2021-01-31: qty 1

## 2021-01-31 NOTE — ED Provider Triage Note (Signed)
Emergency Medicine Provider Triage Evaluation Note  Alease Frame , a 46 y.o. female  was evaluated in triage.  Pt complains of a abscess to the right buttock that has been there for the last several days.  Was seen evaluated urgent care he was sent here.  She reports associated fever up to 102 yesterday and severe pain.  It has been draining.  She thinks she was bit by a spider.  Review of Systems  Positive:  Negative: See above   Physical Exam  BP (!) 146/110    Pulse (!) 121    Temp 100 F (37.8 C) (Oral)    Resp 18    SpO2 97%  Gen:   Awake, no distress   Resp:  Normal effort  MSK:   Moves extremities without difficulty  Other:  Open ulcerative wound to the right buttock with significant surrounding erythema and warmth.  Medical Decision Making  Medically screening exam initiated at 4:49 PM.  Appropriate orders placed.  Mireille A Altier was informed that the remainder of the evaluation will be completed by another provider, this initial triage assessment does not replace that evaluation, and the importance of remaining in the ED until their evaluation is complete.     [place], [place], [place] 01/31/21 1651

## 2021-01-31 NOTE — ED Triage Notes (Signed)
Pt has a open wound to her right buttock that is not currently bleeding, pt states its very tender to touch, area around wound is swollen and red. Pt states it might be a spider bite but she is not sure. Pt states it started bothering her yesterday, she went to UC today and was sent to ED.

## 2021-02-01 ENCOUNTER — Emergency Department (HOSPITAL_COMMUNITY): Payer: Medicare Other

## 2021-02-01 DIAGNOSIS — L0231 Cutaneous abscess of buttock: Secondary | ICD-10-CM | POA: Diagnosis not present

## 2021-02-01 DIAGNOSIS — R590 Localized enlarged lymph nodes: Secondary | ICD-10-CM | POA: Diagnosis not present

## 2021-02-01 DIAGNOSIS — K6289 Other specified diseases of anus and rectum: Secondary | ICD-10-CM | POA: Diagnosis not present

## 2021-02-01 DIAGNOSIS — K604 Rectal fistula: Secondary | ICD-10-CM | POA: Diagnosis not present

## 2021-02-01 LAB — URINALYSIS, ROUTINE W REFLEX MICROSCOPIC
Bilirubin Urine: NEGATIVE
Glucose, UA: NEGATIVE mg/dL
Hgb urine dipstick: NEGATIVE
Ketones, ur: 5 mg/dL — AB
Nitrite: POSITIVE — AB
Protein, ur: 30 mg/dL — AB
Specific Gravity, Urine: 1.026 (ref 1.005–1.030)
pH: 5 (ref 5.0–8.0)

## 2021-02-01 MED ORDER — HYDROCODONE-ACETAMINOPHEN 5-325 MG PO TABS
1.0000 | ORAL_TABLET | Freq: Once | ORAL | Status: AC
  Administered 2021-02-01: 1 via ORAL
  Filled 2021-02-01: qty 1

## 2021-02-01 MED ORDER — CLINDAMYCIN HCL 300 MG PO CAPS
300.0000 mg | ORAL_CAPSULE | Freq: Once | ORAL | Status: AC
  Administered 2021-02-01: 300 mg via ORAL
  Filled 2021-02-01: qty 1

## 2021-02-01 MED ORDER — LIDOCAINE HCL (PF) 1 % IJ SOLN
30.0000 mL | Freq: Once | INTRAMUSCULAR | Status: AC
  Administered 2021-02-01: 30 mL
  Filled 2021-02-01: qty 30

## 2021-02-01 MED ORDER — CLINDAMYCIN HCL 300 MG PO CAPS: 300.0000 mg | ORAL_CAPSULE | Freq: Three times a day (TID) | ORAL | 0 refills | Status: AC

## 2021-02-01 MED ORDER — HYDROCODONE-ACETAMINOPHEN 5-325 MG PO TABS: 1.0000 | ORAL_TABLET | Freq: Three times a day (TID) | ORAL | 0 refills | Status: AC | PRN

## 2021-02-01 NOTE — ED Notes (Signed)
Upon arrival to the lobby Pt has requested that if she is resting she would like [place] to hold off on vitals until she wakes up.

## 2021-02-01 NOTE — ED Provider Notes (Signed)
[facility] DEPT Provider Note   CSN: [id number] Arrival date & time: 01/31/21  1549     History  Chief Complaint  Patient presents with   Wound Check    Kendra Roberts is a 46 y.o. female.  HPI      46 year old female comes in with chief complaint of wound check. She reports having some pain over her right gluteal region since Monday.  Yesterday she had purulent drainage from the site.  She went to urgent care and they sent her to the ER.  While waiting, patient felt like she was having fevers and chills.  There has not been any additional drainage.  Home Medications Prior to Admission medications   Medication Sig Start Date End Date Taking? Authorizing Provider  albuterol (PROVENTIL HFA;VENTOLIN HFA) 108 (90 BASE) MCG/ACT inhaler Inhale 2 puffs into the lungs every 6 (six) hours as needed for wheezing.   Yes [provider]  albuterol (PROVENTIL) (2.5 MG/3ML) 0.083% nebulizer solution Take 2.5 mg by nebulization every 6 (six) hours as needed for wheezing or shortness of breath.   Yes [provider]  cholecalciferol (VITAMIN D3) 25 MCG (1000 UNIT) tablet Take 1,000 Units by mouth daily.   Yes [provider]  citalopram (CELEXA) 20 MG tablet Take 20 mg by mouth daily. 05/13/19  Yes [provider]  clindamycin (CLEOCIN) 300 MG capsule Take 1 capsule (300 mg total) by mouth 3 (three) times daily. 02/01/21  Yes Varney Biles, MD  cyclobenzaprine (FLEXERIL) 10 MG tablet Take 1 tablet (10 mg total) by mouth 2 (two) times daily as needed for muscle spasms. 10/05/14  Yes Neese, [place], NP  fluticasone (FLONASE) 50 MCG/ACT nasal spray Place 1 spray into both nostrils daily. 01/15/21  Yes [provider]  gabapentin (NEURONTIN) 400 MG capsule Take 400 mg by mouth 3 (three) times daily. 04/17/19  Yes [provider]  montelukast (SINGULAIR) 10 MG tablet Take 10 mg by mouth daily.  06/10/19  Yes [provider]  Multiple Vitamin (MULTIVITAMIN WITH MINERALS) TABS tablet Take 1 tablet by mouth daily.   Yes [provider]  pantoprazole (PROTONIX) 40 MG tablet Take 40 mg by mouth daily. 06/03/19  Yes [provider]  QUEtiapine (SEROQUEL) 25 MG tablet Take 25 mg by mouth at bedtime. 01/07/21  Yes [provider]  Grant Ruts INHUB 250-50 MCG/DOSE AEPB Inhale 1 puff into the lungs 2 (two) times daily.  04/19/19  Yes [provider]      Allergies    Latex and Penicillins    Review of Systems   Review of Systems  Constitutional:  Positive for activity change and fever.  Skin:  Positive for rash.   Physical Exam Updated Vital Signs BP 135/68    Pulse 90    Temp 99.1 F (37.3 C) (Oral)    Resp 18    SpO2 100%  Physical Exam Vitals and nursing note reviewed.  Constitutional:      Appearance: She is well-developed.  HENT:     Head: Atraumatic.  Cardiovascular:     Rate and Rhythm: Normal rate.  Pulmonary:     Effort: Pulmonary effort is normal.  Musculoskeletal:     Cervical back: Normal range of motion and neck supple.  Skin:    General: Skin is warm and dry.     Findings: Erythema, lesion and rash present.     Comments: Large erythematous lesion over the right gluteal muscle.  In  the center there is increased induration.  There is tenderness to palpation and warmth to touch.   Neurological:     Mental Status: She is alert and oriented to person, place, and time.    ED Results / Procedures / Treatments   Labs (all labs ordered are listed, but only abnormal results are displayed) Labs Reviewed  COMPREHENSIVE METABOLIC PANEL - Abnormal; Notable for the following components:      Result Value   Sodium 131 (*)    Glucose, Bld 105 (*)    Total Protein 8.3 (*)    All other components within normal limits  CBC WITH DIFFERENTIAL/PLATELET - Abnormal; Notable for the following components:   WBC 13.5 (*)    Hemoglobin 10.3 (*)    HCT 32.0 (*)     MCV 77.9 (*)    MCH 25.1 (*)    RDW 18.1 (*)    Neutro Abs 11.0 (*)    Monocytes Absolute 1.1 (*)    All other components within normal limits  URINALYSIS, ROUTINE W REFLEX MICROSCOPIC - Abnormal; Notable for the following components:   Color, Urine AMBER (*)    APPearance CLOUDY (*)    Ketones, ur 5 (*)    Protein, ur 30 (*)    Nitrite POSITIVE (*)    Leukocytes,Ua TRACE (*)    Bacteria, UA MANY (*)    All other components within normal limits  CULTURE, BLOOD (ROUTINE X 2)  CULTURE, BLOOD (ROUTINE X 2)  LACTIC ACID, PLASMA  PROTIME-INR  I-STAT BETA HCG BLOOD, ED ([facility], [facility], [facility] ONLY)    EKG None  Radiology CT PELVIS WO CONTRAST  Result Date: 02/01/2021 CLINICAL DATA:  Rectal fistula suspected post drainage attempt - evaluate for abscess. EXAM: CT PELVIS WITHOUT CONTRAST TECHNIQUE: Multidetector CT imaging of the pelvis was performed following the standard protocol without intravenous contrast. COMPARISON:  2019 FINDINGS: Urinary Tract: Bladder is unremarkable. Bowel: Visualized small and large bowel are normal in caliber without abnormal wall thickening. Reproductive: Uterus and bilateral adnexa are unremarkable. Lymphatic: Slightly prominent right inguinal lymph nodes, likely reactive. Other: No pelvic ascites. Musculoskeletal: No aggressive osseous lesions. Marked subcutaneous fat stranding in the right lateral buttocks without organized fluid collection or abscess. IMPRESSION: 1. Marked subcutaneous inflammatory changes of the right lateral buttocks without organized fluid collection or abscess. No findings to suggest colocutaneous fistula as queried. 2. No acute intrapelvic process. Electronically Signed   By: Albin Felling M.D.   On: 02/01/2021 13:23    Procedures Ultrasound ED Soft Tissue  Date/Time: 02/01/2021 2:32 PM Performed by: Varney Biles, MD Authorized by: Varney Biles, MD   Procedure details:    Indications: localization of abscess and evaluate for cellulitis      Transverse view:  Visualized   Longitudinal view:  Visualized   Images: archived   Location:    Location: buttocks     Side:  Right Findings:     abscess present    cellulitis present .Marland KitchenIncision and Drainage  Date/Time: 02/01/2021 2:32 PM Performed by: Varney Biles, MD Authorized by: Varney Biles, MD   Consent:    Consent obtained:  Verbal   Consent given by:  Patient   Risks, benefits, and alternatives were discussed: yes     Risks discussed:  Bleeding, incomplete drainage and pain   Alternatives discussed:  No treatment Universal protocol:    Procedure explained and questions answered to patient or proxy's satisfaction: yes     Immediately prior to procedure, a time  out was called: yes     Patient identity confirmed:  Arm band Location:    Type:  Abscess   Location:  Lower extremity   Lower extremity location:  Buttock   Buttock location:  R buttock Pre-procedure details:    Skin preparation:  Antiseptic wash and chlorhexidine with alcohol Sedation:    Sedation type:  None Anesthesia:    Anesthesia method:  Local infiltration   Local anesthetic:  Lidocaine 1% w/o epi Procedure type:    Complexity:  Complex Procedure details:    Ultrasound guidance: yes     Incision types:  Single straight   Incision depth:  Submucosal   Wound management:  Probed and deloculated, irrigated with saline, extensive cleaning and debrided   Drainage:  Purulent and serosanguinous   Drainage amount:  Moderate   Wound treatment:  Wound left open Post-procedure details:    Procedure completion:  Tolerated well, no immediate complications .Marland KitchenIncision and Drainage  Date/Time: 02/01/2021 2:34 PM Performed by: Varney Biles, MD Authorized by: Varney Biles, MD   Consent:    Consent obtained:  Verbal   Consent given by:  Patient   Risks, benefits, and alternatives were discussed: yes     Risks discussed:  Bleeding, incomplete drainage and pain Universal protocol:    Procedure  explained and questions answered to patient or proxy's satisfaction: yes     Immediately prior to procedure, a time out was called: yes     Patient identity confirmed:  Arm band Location:    Type:  Abscess Pre-procedure details:    Skin preparation:  Antiseptic wash and chlorhexidine with alcohol Sedation:    Sedation type:  None Anesthesia:    Anesthesia method:  Local infiltration   Local anesthetic:  Lidocaine 1% w/o epi Procedure type:    Complexity:  Complex Procedure details:    Ultrasound guidance: yes     Needle aspiration: yes     Needle size:  18 G   Incision types:  Stab incision   Incision depth:  Subcutaneous   Drainage:  Purulent   Drainage amount:  Moderate Post-procedure details:    Procedure completion:  Tolerated    Medications Ordered in ED Medications  HYDROcodone-acetaminophen (NORCO/VICODIN) 5-325 MG per tablet 1 tablet (has no administration in time range)  lidocaine (PF) (XYLOCAINE) 1 % injection 30 mL (30 mLs Infiltration Given 02/01/21 1151)  clindamycin (CLEOCIN) capsule 300 mg (300 mg Oral Given 02/01/21 1151)  HYDROcodone-acetaminophen (NORCO/VICODIN) 5-325 MG per tablet 1 tablet (1 tablet Oral Given 02/01/21 1412)    ED Course/ Medical Decision Making/ A&P                           Medical Decision Making  46 year old female comes in with chief complaint of gluteal pain.  She has large area of erythematous, indurated gluteal region, consistent with cellulitis.  Had to complete bedside ultrasound to see if there is any abscess pocket.  There were 2 distinct area that appeared to have a track with fluid buildup, but they were deep.  We proceeded with aspiration attempt first, and had about 5 cc of purulent drainage.  Repeat ultrasound confirmed more abscess.  We incised another area and deloculated the region to get more purulence out.  There was stool last area of erythema and edema over the gluteal wall.  Concerned that perhaps infection was  deeper and there was more abscess left.  CT scan was ordered, and it  shows satisfactory removal of abscess.  Will keep the wound site open and advised patient to come back in 2 to 3 days for recheck.  In the interim we will start on clindamycin given abscess with cellulitis.  She is immunocompetent and appears nontoxic.  Does not need admission for cellulitis at this time.        Final Clinical Impression(s) / ED Diagnoses Final diagnoses:  Cellulitis and abscess of buttock    Rx / DC Orders ED Discharge Orders          Ordered    clindamycin (CLEOCIN) 300 MG capsule  3 times daily        02/01/21 [place], Tuwana Kapaun, MD 02/01/21 1438

## 2021-02-01 NOTE — Discharge Instructions (Signed)
You have abscess and cellulitis of your buttock.  The abscess was drained in the ER.  Please start taking the antibiotics as prescribed.  Other see your primary care doctor in 3 days or return to the ER for wound recheck in 3 days.  Apply warm soaks to the site of incision.

## 2021-02-05 LAB — CULTURE, BLOOD (ROUTINE X 2)
Culture: NO GROWTH
Special Requests: ADEQUATE

## 2021-02-06 LAB — CULTURE, BLOOD (ROUTINE X 2)
Culture: NO GROWTH
Special Requests: ADEQUATE

## 2021-02-21 DIAGNOSIS — J45909 Unspecified asthma, uncomplicated: Secondary | ICD-10-CM | POA: Diagnosis not present

## 2021-02-21 DIAGNOSIS — N3001 Acute cystitis with hematuria: Secondary | ICD-10-CM | POA: Diagnosis not present

## 2021-02-21 DIAGNOSIS — K219 Gastro-esophageal reflux disease without esophagitis: Secondary | ICD-10-CM | POA: Diagnosis not present

## 2021-02-21 DIAGNOSIS — D649 Anemia, unspecified: Secondary | ICD-10-CM | POA: Diagnosis not present

## 2021-02-21 DIAGNOSIS — R569 Unspecified convulsions: Secondary | ICD-10-CM | POA: Diagnosis not present

## 2021-02-21 DIAGNOSIS — R7303 Prediabetes: Secondary | ICD-10-CM | POA: Diagnosis not present

## 2021-02-21 DIAGNOSIS — I10 Essential (primary) hypertension: Secondary | ICD-10-CM | POA: Diagnosis not present

## 2021-02-21 DIAGNOSIS — L0232 Furuncle of buttock: Secondary | ICD-10-CM | POA: Diagnosis not present

## 2021-02-21 DIAGNOSIS — E782 Mixed hyperlipidemia: Secondary | ICD-10-CM | POA: Diagnosis not present

## 2021-02-26 DIAGNOSIS — Z9889 Other specified postprocedural states: Secondary | ICD-10-CM | POA: Diagnosis not present

## 2021-02-26 DIAGNOSIS — L0231 Cutaneous abscess of buttock: Secondary | ICD-10-CM | POA: Diagnosis not present

## 2021-03-16 ENCOUNTER — Emergency Department (HOSPITAL_COMMUNITY)
Admission: EM | Admit: 2021-03-16 | Discharge: 2021-03-16 | Disposition: A | Discharge status: Home / Self Care | Payer: Medicare Other | Attending: Emergency Medicine | Admitting: Emergency Medicine

## 2021-03-16 ENCOUNTER — Encounter (HOSPITAL_COMMUNITY): Payer: Self-pay | Admitting: Emergency Medicine

## 2021-03-16 ENCOUNTER — Other Ambulatory Visit: Payer: Self-pay

## 2021-03-16 ENCOUNTER — Emergency Department (HOSPITAL_COMMUNITY): Payer: Medicare Other

## 2021-03-16 DIAGNOSIS — N9489 Other specified conditions associated with female genital organs and menstrual cycle: Secondary | ICD-10-CM | POA: Insufficient documentation

## 2021-03-16 DIAGNOSIS — R55 Syncope and collapse: Secondary | ICD-10-CM

## 2021-03-16 DIAGNOSIS — R0789 Other chest pain: Secondary | ICD-10-CM | POA: Diagnosis not present

## 2021-03-16 DIAGNOSIS — R079 Chest pain, unspecified: Secondary | ICD-10-CM

## 2021-03-16 DIAGNOSIS — M549 Dorsalgia, unspecified: Secondary | ICD-10-CM | POA: Diagnosis not present

## 2021-03-16 LAB — TROPONIN I (HIGH SENSITIVITY)
Troponin I (High Sensitivity): 5 ng/L (ref <18)
Troponin I (High Sensitivity): 5 ng/L (ref <18)

## 2021-03-16 LAB — BASIC METABOLIC PANEL
Anion gap: 9 (ref 5–15)
BUN: 18 mg/dL (ref 6–20)
CO2: 22 mmol/L (ref 22–32)
Calcium: 8.9 mg/dL (ref 8.9–10.3)
Chloride: 106 mmol/L (ref 98–111)
Creatinine, Ser: 0.8 mg/dL (ref 0.44–1.00)
GFR, Estimated: >60 mL/min (ref >60)
Glucose, Bld: 101 mg/dL — ABNORMAL HIGH (ref 70–99)
Potassium: 4 mmol/L (ref 3.5–5.1)
Sodium: 137 mmol/L (ref 135–145)

## 2021-03-16 LAB — CBC
HCT: 31.2 % — ABNORMAL LOW (ref 36.0–46.0)
Hemoglobin: 9.9 g/dL — ABNORMAL LOW (ref 12.0–15.0)
MCH: 24.7 pg — ABNORMAL LOW (ref 26.0–34.0)
MCHC: 31.7 g/dL (ref 30.0–36.0)
MCV: 77.8 fL — ABNORMAL LOW (ref 80.0–100.0)
Platelets: 245 10*3/uL (ref 150–400)
RBC: 4.01 MIL/uL (ref 3.87–5.11)
RDW: 18.7 % — ABNORMAL HIGH (ref 11.5–15.5)
WBC: 5.7 10*3/uL (ref 4.0–10.5)
nRBC: 0 % (ref 0.0–0.2)

## 2021-03-16 LAB — I-STAT BETA HCG BLOOD, ED (MC, WL, AP ONLY): I-stat hCG, quantitative: <5 m[IU]/mL (ref <5)

## 2021-03-16 MED ORDER — KETOROLAC TROMETHAMINE 15 MG/ML IJ SOLN
15.0000 mg | Freq: Once | INTRAMUSCULAR | Status: AC
  Administered 2021-03-16: 15 mg via INTRAVENOUS
  Filled 2021-03-16: qty 1

## 2021-03-16 MED ORDER — CYCLOBENZAPRINE HCL 10 MG PO TABS: 10.0000 mg | ORAL_TABLET | Freq: Two times a day (BID) | ORAL | 0 refills | Status: AC | PRN

## 2021-03-16 NOTE — ED Provider Notes (Incomplete)
[facility] EMERGENCY DEPARTMENT Provider Note   CSN: [id number] Arrival date & time: 03/16/21  1036     History {Add pertinent medical, surgical, social history, OB history to HPI:1} Chief Complaint  Patient presents with   Chest Pain   Loss of Consciousness    Kendra Roberts is a 46 y.o. female.   Chest Pain Associated symptoms: syncope   Loss of Consciousness Associated symptoms: chest pain       Home Medications Prior to Admission medications   Medication Sig Start Date End Date Taking? Authorizing Provider  albuterol (PROVENTIL HFA;VENTOLIN HFA) 108 (90 BASE) MCG/ACT inhaler Inhale 2 puffs into the lungs every 6 (six) hours as needed for wheezing.    [provider]  albuterol (PROVENTIL) (2.5 MG/3ML) 0.083% nebulizer solution Take 2.5 mg by nebulization every 6 (six) hours as needed for wheezing or shortness of breath.    [provider]  cholecalciferol (VITAMIN D3) 25 MCG (1000 UNIT) tablet Take 1,000 Units by mouth daily.    [provider]  citalopram (CELEXA) 20 MG tablet Take 20 mg by mouth daily. 05/13/19   [provider]  clindamycin (CLEOCIN) 300 MG capsule Take 1 capsule (300 mg total) by mouth 3 (three) times daily. 02/01/21   Varney Biles, MD  cyclobenzaprine (FLEXERIL) 10 MG tablet Take 1 tablet (10 mg total) by mouth 2 (two) times daily as needed for muscle spasms. 10/05/14   Ashley [place], NP  fluticasone (FLONASE) 50 MCG/ACT nasal spray Place 1 spray into both nostrils daily. 01/15/21   [provider]  gabapentin (NEURONTIN) 400 MG capsule Take 400 mg by mouth 3 (three) times daily. 04/17/19   [provider]  montelukast (SINGULAIR) 10 MG tablet Take 10 mg by mouth daily.  06/10/19   [provider]  Multiple Vitamin (MULTIVITAMIN WITH MINERALS) TABS tablet Take 1 tablet by mouth daily.    [provider]  pantoprazole (PROTONIX) 40 MG tablet Take 40 mg by mouth  daily. 06/03/19   [provider]  QUEtiapine (SEROQUEL) 25 MG tablet Take 25 mg by mouth at bedtime. 01/07/21   [provider]  Grant Ruts INHUB 250-50 MCG/DOSE AEPB Inhale 1 puff into the lungs 2 (two) times daily.  04/19/19   [provider]      Allergies    Latex and Penicillins    Review of Systems   Review of Systems  Cardiovascular:  Positive for chest pain and syncope.   Physical Exam Updated Vital Signs BP 119/82    Pulse 91    Temp 98.2 F (36.8 C) (Oral)    Resp 20    LMP 02/11/2021    SpO2 100%  Physical Exam  ED Results / Procedures / Treatments   Labs (all labs ordered are listed, but only abnormal results are displayed) Labs Reviewed  BASIC METABOLIC PANEL  CBC  I-STAT BETA HCG BLOOD, ED ([facility], [facility], [facility] ONLY)  TROPONIN I (HIGH SENSITIVITY)    EKG None  Radiology No results found.  Procedures Procedures  {Document cardiac monitor, telemetry assessment procedure when appropriate:1}  Medications Ordered in ED Medications - No data to display  ED Course/ Medical Decision Making/ A&P                           Medical Decision Making Amount and/or Complexity of Data Reviewed Labs: ordered. Radiology: ordered.   ***  {Document critical care time when appropriate:1} {  Document review of labs and clinical decision tools ie heart score, Chads2Vasc2 etc:1}  {Document your independent review of radiology images, and any outside records:1} {Document your discussion with family members, caretakers, and with consultants:1} {Document social determinants of health affecting pt's care:1} {Document your decision making why or why not admission, treatments were needed:1} Final Clinical Impression(s) / ED Diagnoses Final diagnoses:  None    Rx / DC Orders ED Discharge Orders     None

## 2021-03-16 NOTE — ED Provider Notes (Signed)
[facility] EMERGENCY DEPARTMENT Provider Note   CSN: [id number] Arrival date & time: 03/16/21  1036  History/HPI:  Chief Complaint  Patient presents with   Chest Pain   Loss of Consciousness    [place] who presents today with complaint of chest and back pain.  Patient stated she was getting ready for church this morning when she had sudden onset of pain that started in her back between her scapula and her spine.  Radiated to her chest.  Pain was severe side.  Worsened with movement.  Pain was so bad it caused her to pass out.  She did not have any seizure-like activity.  No incontinence.  Pain has since been improving.  No further episodes of syncope.  No shortness of breath..  Home Meds: Prior to Admission medications   Medication Sig Start Date End Date Taking? Authorizing Provider  cyclobenzaprine (FLEXERIL) 10 MG tablet Take 1 tablet (10 mg total) by mouth 2 (two) times daily as needed for muscle spasms. 03/16/21  Yes Jacelyn Pi, MD  albuterol (PROVENTIL HFA;VENTOLIN HFA) 108 (90 BASE) MCG/ACT inhaler Inhale 2 puffs into the lungs every 6 (six) hours as needed for wheezing.    [provider]  albuterol (PROVENTIL) (2.5 MG/3ML) 0.083% nebulizer solution Take 2.5 mg by nebulization every 6 (six) hours as needed for wheezing or shortness of breath.    [provider]  cholecalciferol (VITAMIN D3) 25 MCG (1000 UNIT) tablet Take 1,000 Units by mouth daily.    [provider]  citalopram (CELEXA) 20 MG tablet Take 20 mg by mouth daily. 05/13/19   [provider]  clindamycin (CLEOCIN) 300 MG capsule Take 1 capsule (300 mg total) by mouth 3 (three) times daily. 02/01/21   Varney Biles, MD  fluticasone (FLONASE) 50 MCG/ACT nasal spray Place 1 spray into both nostrils daily. 01/15/21   [provider]  gabapentin (NEURONTIN) 400 MG capsule Take 400 mg by mouth 3 (three) times daily. 04/17/19   [provider]   montelukast (SINGULAIR) 10 MG tablet Take 10 mg by mouth daily.  06/10/19   [provider]  Multiple Vitamin (MULTIVITAMIN WITH MINERALS) TABS tablet Take 1 tablet by mouth daily.    [provider]  pantoprazole (PROTONIX) 40 MG tablet Take 40 mg by mouth daily. 06/03/19   [provider]  QUEtiapine (SEROQUEL) 25 MG tablet Take 25 mg by mouth at bedtime. 01/07/21   [provider]  Grant Ruts INHUB 250-50 MCG/DOSE AEPB Inhale 1 puff into the lungs 2 (two) times daily.  04/19/19   [provider]    Allergies: Latex and Penicillins  ROS: Review of Systems  Constitutional:  Negative for chills and fever.  Respiratory:  Negative for shortness of breath.   Cardiovascular:  Positive for chest pain.  Gastrointestinal:  Negative for nausea and vomiting.  Musculoskeletal:  Positive for back pain.  Neurological:  Positive for loss of consciousness. Negative for seizures and weakness.   Physical Exam: Physical Exam Constitutional:      Appearance: She is well-developed. She is obese.  Eyes:     Pupils: Pupils are equal, round, and reactive to light.  Cardiovascular:     Rate and Rhythm: Normal rate and regular rhythm.  Pulmonary:     Effort: Pulmonary effort is normal. No tachypnea or accessory muscle usage.  Chest:     Chest wall: Tenderness (left chest wall.) present.  Abdominal:     Palpations: Abdomen is soft.  Tenderness: There is no abdominal tenderness.  Musculoskeletal:     Cervical back: Normal range of motion.     Right lower leg: No tenderness. No edema.     Left lower leg: No tenderness. No edema.     Comments: Point tenderness to the left upper paraspinal musculature of the back between the scapula and the spine.  No midline tenderness.  Skin:    General: Skin is warm.  Neurological:     General: No focal deficit present.     Mental Status: She is alert and oriented to person, place, and time.     Cranial Nerves: No cranial  nerve deficit.     Motor: No weakness.    Procedures: Procedures   MDM/ED Course: This patient presents to the ED for concern of chest and back pain, this involves an extensive number of treatment options, and is a complaint that carries with it a high risk of complications and morbidity.  The differential diagnosis includes ACS, msk pain.   Additional history obtained: Records reviewed Primary Care Documents  Lab Tests: I Ordered, and personally interpreted labs.  The pertinent results include: CBC and BMP were reassuring.  Troponins were negative x2.  Imaging Studies ordered: I ordered imaging studies including X-ray chest   I independently visualized and interpreted imaging which showed negative chest x-ray with no acute findings I agree with the radiologist interpretation  EKG (personally reviewed and interpreted): No STEMI.  No signs of ischemia.  No arrhythmias.  Medical Decision Making: Patient now with back and chest pain.  Reproducible on my exam.  Exquisitely tender on her left back.  She had good pain relief after IM Toradol.  Her work-up was inconsistent with ACS.  Her pain was improving.  It was reproducible.  Her EKG and troponins were within normal limits.  No findings consistent with ischemia.  In regards to her reported Syncope, suspect that she had a vasovagal episode after having a severe pain that started quickly.  She has had no further episodes since.  She has been ambulating without difficulty.  Her blood pressure and heart rate have been stable and within normal limits here in the ED.  We will plan for discharge home.  Lengthy discussion had with patient regarding strict return precautions and the importance of close outpatient follow-up.  We discussed that she is to return to the ED if she has recurrence of symptoms.  Complexity of problems addressed: Patients presentation is most consistent with  acute presentation with potential threat to life or bodily  function  Disposition: After consideration of the diagnostic results and the patients response to treatment,  I feel that the patent would benefit from discharge home .   Patient seen in conjunction with my attending, Dr. Alvino Chapel.  Clinical Impression/Dx: Final diagnoses:  Syncope, unspecified syncope type  Chest pain, unspecified type  Vasovagal episode     Rx/Dc Orders: ED Discharge Orders          Ordered    cyclobenzaprine (FLEXERIL) 10 MG tablet  2 times daily PRN        03/16/21 1332                 Jacelyn Pi, MD 03/17/21 5035    Davonna Belling, MD 03/17/21 1530

## 2021-03-16 NOTE — ED Triage Notes (Addendum)
Pt states, " I was getting ready for church.  [place].  Then I got hot and passed out."  Reports pain "25" out of 10.  Reports mild SOB.

## 2021-05-23 DIAGNOSIS — G5602 Carpal tunnel syndrome, left upper limb: Secondary | ICD-10-CM | POA: Diagnosis not present

## 2021-05-23 DIAGNOSIS — K219 Gastro-esophageal reflux disease without esophagitis: Secondary | ICD-10-CM | POA: Diagnosis not present

## 2021-05-23 DIAGNOSIS — I10 Essential (primary) hypertension: Secondary | ICD-10-CM | POA: Diagnosis not present

## 2021-05-23 DIAGNOSIS — J45909 Unspecified asthma, uncomplicated: Secondary | ICD-10-CM | POA: Diagnosis not present

## 2021-05-23 DIAGNOSIS — R569 Unspecified convulsions: Secondary | ICD-10-CM | POA: Diagnosis not present

## 2021-05-23 DIAGNOSIS — R7303 Prediabetes: Secondary | ICD-10-CM | POA: Diagnosis not present

## 2021-05-23 DIAGNOSIS — M25532 Pain in left wrist: Secondary | ICD-10-CM | POA: Diagnosis not present

## 2021-05-23 DIAGNOSIS — E782 Mixed hyperlipidemia: Secondary | ICD-10-CM | POA: Diagnosis not present

## 2021-06-19 DIAGNOSIS — R569 Unspecified convulsions: Secondary | ICD-10-CM | POA: Diagnosis not present

## 2021-06-19 DIAGNOSIS — K219 Gastro-esophageal reflux disease without esophagitis: Secondary | ICD-10-CM | POA: Diagnosis not present

## 2021-06-19 DIAGNOSIS — I10 Essential (primary) hypertension: Secondary | ICD-10-CM | POA: Diagnosis not present

## 2021-06-19 DIAGNOSIS — R7303 Prediabetes: Secondary | ICD-10-CM | POA: Diagnosis not present

## 2021-06-19 DIAGNOSIS — J45909 Unspecified asthma, uncomplicated: Secondary | ICD-10-CM | POA: Diagnosis not present

## 2021-06-19 DIAGNOSIS — D508 Other iron deficiency anemias: Secondary | ICD-10-CM | POA: Diagnosis not present

## 2021-06-19 DIAGNOSIS — E782 Mixed hyperlipidemia: Secondary | ICD-10-CM | POA: Diagnosis not present

## 2021-06-20 ENCOUNTER — Telehealth: Payer: Self-pay | Admitting: Internal Medicine

## 2021-06-20 ENCOUNTER — Other Ambulatory Visit: Payer: Self-pay | Admitting: Physician Assistant

## 2021-06-20 DIAGNOSIS — Z1231 Encounter for screening mammogram for malignant neoplasm of breast: Secondary | ICD-10-CM

## 2021-06-20 NOTE — Telephone Encounter (Signed)
Scheduled appt per 5/24 referral. Pt is aware of appt date and time. Pt is aware to arrive 15 mins prior to appt time and to bring and updated insurance card. Pt is aware of appt location.

## 2021-06-26 ENCOUNTER — Other Ambulatory Visit: Payer: Self-pay | Admitting: Internal Medicine

## 2021-06-26 DIAGNOSIS — D539 Nutritional anemia, unspecified: Secondary | ICD-10-CM

## 2021-06-27 ENCOUNTER — Inpatient Hospital Stay: Payer: Medicare Other | Admitting: Internal Medicine

## 2021-06-27 ENCOUNTER — Inpatient Hospital Stay: Payer: Medicare Other

## 2021-06-28 ENCOUNTER — Telehealth: Payer: Self-pay | Admitting: Hematology and Oncology

## 2021-06-28 NOTE — Telephone Encounter (Signed)
R/s pt's new hem appt. Pt is aware of new appt date and time.  °

## 2021-07-11 ENCOUNTER — Telehealth: Payer: Self-pay | Admitting: Hematology and Oncology

## 2021-07-11 NOTE — Telephone Encounter (Signed)
Per 6/15 reminder called, called and left message for pt about new pt appointment

## 2021-07-12 ENCOUNTER — Inpatient Hospital Stay: Payer: Medicare Other | Attending: Internal Medicine | Admitting: Hematology and Oncology

## 2021-07-12 ENCOUNTER — Inpatient Hospital Stay: Payer: Medicare Other

## 2021-07-12 NOTE — Progress Notes (Signed)
[facility] [phone]   Fax:([phone] [place] NOTE  Patient Care Team: Pcp, No as PCP - General  Hematological/Oncological History # ***  CHIEF COMPLAINTS/PURPOSE OF CONSULTATION:  "*** "  HISTORY OF PRESENTING ILLNESS:  Kendra Roberts 46 y.o. female with medical history significant for ***  On review of the previous records ***  On exam today ***  MEDICAL HISTORY:  Past Medical History:  Diagnosis Date   Arthritis    Asthma    Kidney stones    Seizures ([place])    One seizure years ago    SURGICAL HISTORY: Past Surgical History:  Procedure Laterality Date   KIDNEY STONE SURGERY     TUBAL LIGATION      SOCIAL HISTORY: Social History   Socioeconomic History   Marital status: Single    Spouse name: Not on file   Number of children: Not on file   Years of education: Not on file   Highest education level: Not on file  Occupational History   Not on file  Tobacco Use   Smoking status: Some Days   Smokeless tobacco: Never  Substance and Sexual Activity   Alcohol use: No   Drug use: No   Sexual activity: Yes    Birth control/protection: Surgical  Other Topics Concern   Not on file  Social History Narrative   Not on file   Social Determinants of Health   Financial Resource Strain: Not on file  Food Insecurity: Not on file  Transportation Needs: Not on file  Physical Activity: Not on file  Stress: Not on file  Social Connections: Not on file  Intimate Partner Violence: Not on file    FAMILY HISTORY: No family history on file.  ALLERGIES:  is allergic to latex and penicillins.  MEDICATIONS:  Current Outpatient Medications  Medication Sig Dispense Refill   albuterol (PROVENTIL HFA;VENTOLIN HFA) 108 (90 BASE) MCG/ACT inhaler Inhale 2 puffs into the lungs every 6 (six) hours as needed for wheezing.     albuterol (PROVENTIL) (2.5 MG/3ML) 0.083% nebulizer solution Take 2.5 mg by nebulization every 6 (six)  hours as needed for wheezing or shortness of breath.     cholecalciferol (VITAMIN D3) 25 MCG (1000 UNIT) tablet Take 1,000 Units by mouth daily.     citalopram (CELEXA) 20 MG tablet Take 20 mg by mouth daily.     clindamycin (CLEOCIN) 300 MG capsule Take 1 capsule (300 mg total) by mouth 3 (three) times daily. 21 capsule 0   cyclobenzaprine (FLEXERIL) 10 MG tablet Take 1 tablet (10 mg total) by mouth 2 (two) times daily as needed for muscle spasms. 20 tablet 0   fluticasone (FLONASE) 50 MCG/ACT nasal spray Place 1 spray into both nostrils daily.     gabapentin (NEURONTIN) 400 MG capsule Take 400 mg by mouth 3 (three) times daily.     montelukast (SINGULAIR) 10 MG tablet Take 10 mg by mouth daily.      Multiple Vitamin (MULTIVITAMIN WITH MINERALS) TABS tablet Take 1 tablet by mouth daily.     pantoprazole (PROTONIX) 40 MG tablet Take 40 mg by mouth daily.     QUEtiapine (SEROQUEL) 25 MG tablet Take 25 mg by mouth at bedtime.     WIXELA INHUB 250-50 MCG/DOSE AEPB Inhale 1 puff into the lungs 2 (two) times daily.      No current facility-administered medications for this visit.    REVIEW OF SYSTEMS:   Constitutional: ( - ) fevers, ( - )  chills , ( - ) night sweats Eyes: ( - ) blurriness of vision, ( - ) double vision, ( - ) watery eyes Ears, nose, mouth, throat, and face: ( - ) mucositis, ( - ) sore throat Respiratory: ( - ) cough, ( - ) dyspnea, ( - ) wheezes Cardiovascular: ( - ) palpitation, ( - ) chest discomfort, ( - ) lower extremity swelling Gastrointestinal:  ( - ) nausea, ( - ) heartburn, ( - ) change in bowel habits Skin: ( - ) abnormal skin rashes Lymphatics: ( - ) new lymphadenopathy, ( - ) easy bruising Neurological: ( - ) numbness, ( - ) tingling, ( - ) new weaknesses Behavioral/Psych: ( - ) mood change, ( - ) new changes  All other systems were reviewed with the patient and are negative.  PHYSICAL EXAMINATION:  There were no vitals filed for this visit. There were no  vitals filed for this visit.  GENERAL: well appearing *** in NAD  SKIN: skin color, texture, turgor are normal, no rashes or significant lesions EYES: conjunctiva are pink and non-injected, sclera clear LUNGS: clear to auscultation and percussion with normal breathing effort HEART: regular rate & rhythm and no murmurs and no lower extremity edema Musculoskeletal: no cyanosis of digits and no clubbing  PSYCH: alert & oriented x 3, fluent speech NEURO: no focal motor/sensory deficits  LABORATORY DATA:  I have reviewed the data as listed    Latest Ref Rng & Units 03/16/2021   10:36 AM 01/31/2021    5:11 PM 08/11/2019    2:40 PM  CBC  WBC 4.0 - 10.5 K/uL 5.7  13.5  7.7   Hemoglobin 12.0 - 15.0 g/dL 9.9  10.3  10.2   Hematocrit 36.0 - 46.0 % 31.2  32.0  32.1   Platelets 150 - 400 K/uL 245  198  205        Latest Ref Rng & Units 03/16/2021   10:36 AM 01/31/2021    5:11 PM 08/11/2019    2:40 PM  CMP  Glucose 70 - 99 mg/dL 101  105  96   BUN 6 - 20 mg/dL 18  13  13    Creatinine 0.44 - 1.00 mg/dL 0.80  0.74  0.99   Sodium 135 - 145 mmol/L 137  131  139   Potassium 3.5 - 5.1 mmol/L 4.0  3.5  4.1   Chloride 98 - 111 mmol/L 106  101  104   CO2 22 - 32 mmol/L 22  23  27    Calcium 8.9 - 10.3 mg/dL 8.9  8.9  9.2   Total Protein 6.5 - 8.1 g/dL  8.3  6.9   Total Bilirubin 0.3 - 1.2 mg/dL  0.6  0.1   Alkaline Phos 38 - 126 U/L  72  59   AST 15 - 41 U/L  23  33   ALT 0 - 44 U/L  13  19      ASSESSMENT & PLAN ***  After review of the labs, review of the records, and discussion with the patient the patients findings are most consistent with ***  No orders of the defined types were placed in this encounter.   All questions were answered. The patient knows to call the clinic with any problems, questions or concerns.  A total of more than 60 minutes were spent on this encounter with face-to-face time and non-face-to-face time, including preparing to see the patient, ordering tests and/or  medications, counseling the patient and  coordination of care as outlined above.   Ledell Peoples, MD Department of Hematology/Oncology [place] [facility] Phone: [phone] Pager: [phone] Email: [email or web address]  07/12/2021 7:23 AM

## 2021-07-23 DIAGNOSIS — Z1212 Encounter for screening for malignant neoplasm of rectum: Secondary | ICD-10-CM | POA: Diagnosis not present

## 2021-07-23 DIAGNOSIS — Z1211 Encounter for screening for malignant neoplasm of colon: Secondary | ICD-10-CM | POA: Diagnosis not present

## 2021-08-01 ENCOUNTER — Other Ambulatory Visit: Payer: Self-pay | Admitting: Physician Assistant

## 2021-08-01 DIAGNOSIS — N644 Mastodynia: Secondary | ICD-10-CM

## 2021-08-25 ENCOUNTER — Encounter (HOSPITAL_COMMUNITY): Payer: Self-pay

## 2021-08-25 ENCOUNTER — Other Ambulatory Visit: Payer: Self-pay

## 2021-08-25 ENCOUNTER — Emergency Department (HOSPITAL_COMMUNITY)
Admission: EM | Admit: 2021-08-25 | Discharge: 2021-08-26 | Disposition: A | Discharge status: Home / Self Care | Payer: Medicare Other | Attending: Emergency Medicine | Admitting: Emergency Medicine

## 2021-08-25 DIAGNOSIS — L03116 Cellulitis of left lower limb: Secondary | ICD-10-CM

## 2021-08-25 DIAGNOSIS — S81832A Puncture wound without foreign body, left lower leg, initial encounter: Secondary | ICD-10-CM | POA: Insufficient documentation

## 2021-08-25 DIAGNOSIS — Z203 Contact with and (suspected) exposure to rabies: Secondary | ICD-10-CM | POA: Diagnosis not present

## 2021-08-25 DIAGNOSIS — Z2914 Encounter for prophylactic rabies immune globin: Secondary | ICD-10-CM | POA: Insufficient documentation

## 2021-08-25 DIAGNOSIS — Z23 Encounter for immunization: Secondary | ICD-10-CM | POA: Insufficient documentation

## 2021-08-25 DIAGNOSIS — W540XXA Bitten by dog, initial encounter: Secondary | ICD-10-CM | POA: Diagnosis not present

## 2021-08-25 DIAGNOSIS — M25532 Pain in left wrist: Secondary | ICD-10-CM | POA: Diagnosis not present

## 2021-08-25 DIAGNOSIS — S8992XA Unspecified injury of left lower leg, initial encounter: Secondary | ICD-10-CM | POA: Diagnosis present

## 2021-08-25 DIAGNOSIS — Z91040 Latex allergy status: Secondary | ICD-10-CM | POA: Insufficient documentation

## 2021-08-25 DIAGNOSIS — J45909 Unspecified asthma, uncomplicated: Secondary | ICD-10-CM | POA: Diagnosis not present

## 2021-08-25 DIAGNOSIS — S63502A Unspecified sprain of left wrist, initial encounter: Secondary | ICD-10-CM

## 2021-08-25 DIAGNOSIS — Z7951 Long term (current) use of inhaled steroids: Secondary | ICD-10-CM | POA: Insufficient documentation

## 2021-08-25 DIAGNOSIS — Z2913 Encounter for prophylactic Rho(D) immune globulin: Secondary | ICD-10-CM | POA: Insufficient documentation

## 2021-08-25 DIAGNOSIS — S81852A Open bite, left lower leg, initial encounter: Secondary | ICD-10-CM | POA: Diagnosis not present

## 2021-08-25 NOTE — ED Triage Notes (Signed)
Patient said she got bit by her own dog last night. 4 bite marks on her left calf on the back side. She fell down and felt a snap in her left wrist. She thinks the dog is vaccinated. Does not know if her tetanus shot is up to date.

## 2021-08-26 ENCOUNTER — Emergency Department (HOSPITAL_COMMUNITY): Payer: Medicare Other

## 2021-08-26 DIAGNOSIS — S81832A Puncture wound without foreign body, left lower leg, initial encounter: Secondary | ICD-10-CM | POA: Diagnosis not present

## 2021-08-26 DIAGNOSIS — M25532 Pain in left wrist: Secondary | ICD-10-CM | POA: Diagnosis not present

## 2021-08-26 MED ORDER — KETOROLAC TROMETHAMINE 15 MG/ML IJ SOLN
15.0000 mg | Freq: Once | INTRAMUSCULAR | Status: AC
  Administered 2021-08-26: 15 mg via INTRAVENOUS
  Filled 2021-08-26: qty 1

## 2021-08-26 MED ORDER — RABIES VACCINE, PCEC IM SUSR
1.0000 mL | Freq: Once | INTRAMUSCULAR | Status: AC
  Administered 2021-08-26: 1 mL via INTRAMUSCULAR
  Filled 2021-08-26: qty 1

## 2021-08-26 MED ORDER — RABIES IMMUNE GLOBULIN 150 UNIT/ML IM INJ
20.0000 [IU]/kg | INJECTION | Freq: Once | INTRAMUSCULAR | Status: AC
  Administered 2021-08-26: 2025 [IU]
  Filled 2021-08-26: qty 20

## 2021-08-26 MED ORDER — SODIUM CHLORIDE 0.9 % IV SOLN
2.0000 g | Freq: Once | INTRAVENOUS | Status: AC
  Administered 2021-08-26: 2 g via INTRAVENOUS
  Filled 2021-08-26: qty 20

## 2021-08-26 MED ORDER — METRONIDAZOLE 500 MG PO TABS: 500.0000 mg | ORAL_TABLET | Freq: Three times a day (TID) | ORAL | 0 refills | Status: AC

## 2021-08-26 MED ORDER — CEFUROXIME AXETIL 500 MG PO TABS: 500.0000 mg | ORAL_TABLET | Freq: Two times a day (BID) | ORAL | 0 refills | Status: AC

## 2021-08-26 MED ORDER — METRONIDAZOLE 500 MG/100ML IV SOLN
500.0000 mg | Freq: Three times a day (TID) | INTRAVENOUS | Status: AC
  Administered 2021-08-26: 500 mg via INTRAVENOUS
  Filled 2021-08-26: qty 100

## 2021-08-26 MED ORDER — TETANUS-DIPHTH-ACELL PERTUSSIS 5-2.5-18.5 LF-MCG/0.5 IM SUSY
0.5000 mL | PREFILLED_SYRINGE | Freq: Once | INTRAMUSCULAR | Status: AC
Start: 2021-08-26 — End: 2021-08-26
  Administered 2021-08-26: 0.5 mL via INTRAMUSCULAR
  Filled 2021-08-26: qty 0.5

## 2021-08-26 MED ORDER — TRAMADOL HCL 50 MG PO TABS: 50.0000 mg | ORAL_TABLET | Freq: Four times a day (QID) | ORAL | 0 refills | Status: DC | PRN

## 2021-08-26 NOTE — ED Provider Notes (Signed)
[facility] DEPT Provider Note   CSN: [id number] Arrival date & time: 08/25/21  2141     History  Chief Complaint  Patient presents with   Animal Bite    Kendra Roberts is a 46 y.o. female.  46 year old female presents to the emergency department for evaluation of a dog bite which occurred on Saturday.  States that she was bitten on her left calf by a stray pit bull.  Notes increased pain to the bite site as well as some developing redness.  She has had some nonspecific drainage.  Denies fevers.  Vaccination status of the dog is unknown.  It was not apprehended by animal control.  After the bite she fell on an outstretched hand and is complaining of pain to her left wrist.   The history is provided by the patient. No language interpreter was used.  Animal Bite      Home Medications Prior to Admission medications   Medication Sig Start Date End Date Taking? Authorizing Provider  cefUROXime (CEFTIN) 500 MG tablet Take 1 tablet (500 mg total) by mouth 2 (two) times daily with a meal for 7 days. 08/26/21 09/02/21 Yes Antonietta Breach, PA-C  metroNIDAZOLE (FLAGYL) 500 MG tablet Take 1 tablet (500 mg total) by mouth 3 (three) times daily for 7 days. 08/26/21 09/02/21 Yes Antonietta Breach, PA-C  albuterol (PROVENTIL HFA;VENTOLIN HFA) 108 (90 BASE) MCG/ACT inhaler Inhale 2 puffs into the lungs every 6 (six) hours as needed for wheezing.    [provider]  albuterol (PROVENTIL) (2.5 MG/3ML) 0.083% nebulizer solution Take 2.5 mg by nebulization every 6 (six) hours as needed for wheezing or shortness of breath.    [provider]  cholecalciferol (VITAMIN D3) 25 MCG (1000 UNIT) tablet Take 1,000 Units by mouth daily.    [provider]  citalopram (CELEXA) 20 MG tablet Take 20 mg by mouth daily. 05/13/19   [provider]  clindamycin (CLEOCIN) 300 MG capsule Take 1 capsule (300 mg total) by mouth 3 (three) times daily. 02/01/21   Varney Biles, MD  cyclobenzaprine (FLEXERIL) 10 MG tablet Take 1 tablet (10 mg total) by mouth 2 (two) times daily as needed for muscle spasms. 03/16/21   Jacelyn Pi, MD  fluticasone (FLONASE) 50 MCG/ACT nasal spray Place 1 spray into both nostrils daily. 01/15/21   [provider]  gabapentin (NEURONTIN) 400 MG capsule Take 400 mg by mouth 3 (three) times daily. 04/17/19   [provider]  montelukast (SINGULAIR) 10 MG tablet Take 10 mg by mouth daily.  06/10/19   [provider]  Multiple Vitamin (MULTIVITAMIN WITH MINERALS) TABS tablet Take 1 tablet by mouth daily.    [provider]  pantoprazole (PROTONIX) 40 MG tablet Take 40 mg by mouth daily. 06/03/19   [provider]  QUEtiapine (SEROQUEL) 25 MG tablet Take 25 mg by mouth at bedtime. 01/07/21   [provider]  [place] INHUB 250-50 MCG/DOSE AEPB Inhale 1 puff into the lungs 2 (two) times daily.  04/19/19   [provider]      Allergies    Latex and Penicillins    Review of Systems   Review of Systems Ten systems reviewed and are negative for acute change, except as noted in the HPI.    Physical Exam Updated Vital Signs BP (!) 146/78   Pulse 60   Temp 98.5 F (36.9 C) (Oral)   Resp 19   Ht '5\' 5"'$  (1.651 m)  Wt 99.8 kg   SpO2 97%   BMI 36.61 kg/m   Physical Exam Vitals and nursing note reviewed.  Constitutional:      General: She is not in acute distress.    Appearance: She is well-developed. She is not diaphoretic.     Comments: Nontoxic-appearing and in no acute distress  HENT:     Head: Normocephalic and atraumatic.  Eyes:     General: No scleral icterus.    Conjunctiva/sclera: Conjunctivae normal.  Cardiovascular:     Rate and Rhythm: Normal rate and regular rhythm.     Pulses: Normal pulses.  Pulmonary:     Effort: Pulmonary effort is normal. No respiratory distress.     Comments: Respirations even and unlabored Musculoskeletal:        General:  Normal range of motion.     Cervical back: Normal range of motion.     Comments: Puncture wounds to the posterior left calf.  No drainage.  There is associated erythema, edema, as well as tenderness to touch.  No crepitus.  Preserved range of motion of the left wrist.  No crepitus or deformity noted.  Skin:    General: Skin is warm and dry.     Coloration: Skin is not pale.     Findings: Erythema present. No rash.  Neurological:     Mental Status: She is alert and oriented to person, place, and time.     Coordination: Coordination normal.  Psychiatric:        Behavior: Behavior normal.       ED Results / Procedures / Treatments   Labs (all labs ordered are listed, but only abnormal results are displayed) Labs Reviewed - No data to display  EKG None  Radiology DG Wrist Complete Left  Result Date: 08/26/2021 CLINICAL DATA:  Recent fall with wrist pain, initial encounter EXAM: LEFT WRIST - COMPLETE 3+ VIEW COMPARISON:  None Available. FINDINGS: There is no evidence of fracture or dislocation. There is no evidence of arthropathy or other focal bone abnormality. Soft tissues are unremarkable. IMPRESSION: No acute abnormality noted. Electronically Signed   By: Inez Catalina M.D.   On: 08/26/2021 02:31    Procedures Procedures    Medications Ordered in ED Medications  metroNIDAZOLE (FLAGYL) IVPB 500 mg (500 mg Intravenous New Bag/Given 08/26/21 0530)  Tdap (BOOSTRIX) injection 0.5 mL (0.5 mLs Intramuscular Given 08/26/21 0533)  ketorolac (TORADOL) 15 MG/ML injection 15 mg (15 mg Intravenous Given 08/26/21 0532)  rabies vaccine (RABAVERT) injection 1 mL (1 mL Intramuscular Given 08/26/21 0541)  rabies immune globulin (HYPERRAB/KEDRAB) injection 2,025 Units (2,025 Units Infiltration Given 08/26/21 0553)  cefTRIAXone (ROCEPHIN) 2 g in sodium chloride 0.9 % 100 mL IVPB (2 g Intravenous New Bag/Given 08/26/21 0525)    ED Course/ Medical Decision Making/ A&P                            Medical Decision Making Amount and/or Complexity of Data Reviewed Radiology: ordered.  Risk Prescription drug management.   This patient presents to the ED for concern of dog bite, this involves an extensive number of treatment options, and is a complaint that carries with it a high risk of complications and morbidity.  The differential diagnosis includes cellulitis vs abscess vs necrotizing infection vs tendon/ligamentous injury   Co morbidities that complicate the patient evaluation  Asthma    Imaging Studies ordered:  I ordered imaging studies including left wrist Xray  I independently visualized and interpreted imaging which showed no acute abnormality I agree with the radiologist interpretation   Medicines ordered and prescription drug management:  I ordered medication including IV Rocephin and Flagyl for cellulitis, Rabavert/Hyperrab for rabies coverage, Tdap updated Reevaluation of the patient after these medicines showed that the patient stayed the same I have reviewed the patients home medicines and have made adjustments as needed   Test Considered:  CBC   Critical Interventions:  Initiation of abx for cellulitis management   Reevaluation:  After the interventions noted above, I reevaluated the patient and found that they have : remained stable   Social Determinants of Health:  Insured patient Established PCP care   Dispostion:  Care signed out to [place], PA-C at shift change.  Considered admission, but without signs of severe or systemic infection believe outpatient course of abx is reasonable; given dose of IV abx in the ED. She will need to return for her 3 subsequent rabies vaccines at which time wound can be re-evaluated.          Final Clinical Impression(s) / ED Diagnoses Final diagnoses:  Dog bite of lower extremity  Wrist sprain, left, initial encounter    Rx / DC Orders ED Discharge Orders          Ordered    cefUROXime  (CEFTIN) 500 MG tablet  2 times daily with meals        08/26/21 0617    metroNIDAZOLE (FLAGYL) 500 MG tablet  3 times daily        08/26/21 0617              Antonietta Breach, PA-C 08/26/21 [id number]    Quintella Reichert, MD 08/27/21 [phone]

## 2021-08-26 NOTE — Discharge Instructions (Addendum)
Return for your rabies vaccination series on 08/29/21, 09/02/21, and 09/09/21.  Have your bite wound rechecked in 48-72 hours. Take the antibiotics prescribed to you until finished. Use tylenol or ibuprofen for pain. Return for worsening symptoms such as increased redness, worsening pain, fever.                                     RABIES VACCINE FOLLOW UP  Patient's Name: Kendra Roberts                     Original Order Date:08/25/2021  Medical Record Number: [id number]  ED Physician: Quintella Reichert, MD Primary Diagnosis: Rabies Exposure       PCP: Gevena Mart, DO  Patient Phone Number: (home) [phone] (home)    (cell)  Telephone Information:  Mobile [phone]    (work) There is no work phone number on file. Species of Animal:     You have been seen in the Emergency Department for a possible rabies exposure. It's very important you return for the additional vaccine doses.  Please call the clinic listed below for hours of operation.   Clinic that will administer your rabies vaccines: [facility] at [place], [place], [place] [address] Hours:  Closed ? Opens 8?AM Confirmed by this business 8 weeks ago Phone: [phone]     DAY 0:  08/25/2021      DAY 3:  08/28/2021       DAY 7:  09/01/2021     DAY 14:  09/08/2021         The 5th vaccine injection is considered for immune compromised patients only.  DAY 28:  09/22/2021

## 2021-08-30 ENCOUNTER — Telehealth: Payer: Self-pay | Admitting: *Deleted

## 2021-08-30 NOTE — Telephone Encounter (Signed)
        Patient  visited  [facility] on 08/26/2021  for dog bite    Telephone encounter attempt :  1st Unable to leave message [facility] [phone] [address] , [place] [address] Email : [email or web address]

## 2021-09-02 ENCOUNTER — Telehealth: Payer: Self-pay | Admitting: *Deleted

## 2021-09-02 NOTE — Telephone Encounter (Signed)
        Patient  visited [facility] on 7/37/2023  for Dog bite   Telephone encounter attempt :  2nd call hangs up    [facility] [phone] [address] , [place] [address] Email : [email or web address] [email or web address]

## 2021-09-03 ENCOUNTER — Telehealth: Payer: Self-pay | Admitting: *Deleted

## 2021-09-03 NOTE — Telephone Encounter (Signed)
        Patient  visited [facility]  on 08/26/2021  for dog bite   Telephone encounter attempt :  3rd  number just hangs up  [facility] [phone] [address] , [place] [address] Email : [email or web address] [email or web address]

## 2021-10-05 ENCOUNTER — Ambulatory Visit (HOSPITAL_COMMUNITY): Payer: Self-pay | Admitting: Psychiatry

## 2021-10-23 ENCOUNTER — Telehealth (HOSPITAL_BASED_OUTPATIENT_CLINIC_OR_DEPARTMENT_OTHER): Payer: Medicare Other | Admitting: Student in an Organized Health Care Education/Training Program

## 2021-10-23 ENCOUNTER — Encounter (HOSPITAL_COMMUNITY): Payer: Self-pay | Admitting: Student in an Organized Health Care Education/Training Program

## 2021-10-23 DIAGNOSIS — F4312 Post-traumatic stress disorder, chronic: Secondary | ICD-10-CM | POA: Diagnosis not present

## 2021-10-23 DIAGNOSIS — Z87898 Personal history of other specified conditions: Secondary | ICD-10-CM

## 2021-10-23 DIAGNOSIS — F250 Schizoaffective disorder, bipolar type: Secondary | ICD-10-CM | POA: Diagnosis not present

## 2021-10-23 DIAGNOSIS — F609 Personality disorder, unspecified: Secondary | ICD-10-CM | POA: Diagnosis not present

## 2021-10-23 DIAGNOSIS — F259 Schizoaffective disorder, unspecified: Secondary | ICD-10-CM | POA: Insufficient documentation

## 2021-10-23 MED ORDER — QUETIAPINE FUMARATE 100 MG PO TABS: 100.0000 mg | ORAL_TABLET | Freq: Every day | ORAL | 1 refills | Status: DC
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
## 2021-11-18 DIAGNOSIS — K219 Gastro-esophageal reflux disease without esophagitis: Secondary | ICD-10-CM | POA: Diagnosis not present

## 2021-11-18 DIAGNOSIS — R7303 Prediabetes: Secondary | ICD-10-CM | POA: Diagnosis not present

## 2021-11-18 DIAGNOSIS — J45909 Unspecified asthma, uncomplicated: Secondary | ICD-10-CM | POA: Diagnosis not present

## 2021-11-18 DIAGNOSIS — D508 Other iron deficiency anemias: Secondary | ICD-10-CM | POA: Diagnosis not present

## 2021-11-18 DIAGNOSIS — M79601 Pain in right arm: Secondary | ICD-10-CM | POA: Diagnosis not present

## 2021-11-18 DIAGNOSIS — E782 Mixed hyperlipidemia: Secondary | ICD-10-CM | POA: Diagnosis not present

## 2021-11-18 DIAGNOSIS — R569 Unspecified convulsions: Secondary | ICD-10-CM | POA: Diagnosis not present

## 2021-11-18 DIAGNOSIS — I10 Essential (primary) hypertension: Secondary | ICD-10-CM | POA: Diagnosis not present

## 2021-11-19 ENCOUNTER — Ambulatory Visit
Admission: EM | Admit: 2021-11-19 | Discharge: 2021-11-19 | Disposition: A | Discharge status: Home / Self Care | Payer: Medicare Other

## 2021-11-19 DIAGNOSIS — M79631 Pain in right forearm: Secondary | ICD-10-CM

## 2021-11-19 DIAGNOSIS — Z86718 Personal history of other venous thrombosis and embolism: Secondary | ICD-10-CM

## 2021-11-19 NOTE — Discharge Instructions (Addendum)
Please head to the emergency room as I am concerned that you may have a recurrent dvt especially given your history of a blood clot in your legs. Both your doctor and I have recommended this, please head straight there.

## 2021-11-19 NOTE — ED Triage Notes (Signed)
Patient presetns to UC for rash. Provider triaged.

## 2021-11-19 NOTE — ED Provider Notes (Signed)
[facility] - URGENT [facility]ARE [facility]  Note:  This document was prepared using Systems analyst and may include unintentional dictation errors.  MRN: [id number] DOB: September 15, 1975  Subjective:   Kendra Roberts is a 46 y.o. female presenting for 1 week history of persistent and worsening severe right arm pain with swelling. Contacted her PCP and was advised to report to the emergency room but she did not want to go there and instead came to our urgent care. Has a history of DVT in lower extremity and that was their concern. She is not on any anticoagulation. Has been using cold and warm compresses, NSAID and is worsening. No chest pain, shob.   No current facility-administered medications for this encounter.  Current Outpatient Medications:    albuterol (PROVENTIL HFA;VENTOLIN HFA) 108 (90 BASE) MCG/ACT inhaler, Inhale 2 puffs into the lungs every 6 (six) hours as needed for wheezing., Disp: , Rfl:    albuterol (PROVENTIL) (2.5 MG/3ML) 0.083% nebulizer solution, Take 2.5 mg by nebulization every 6 (six) hours as needed for wheezing or shortness of breath., Disp: , Rfl:    AMLODIPINE BENZOATE PO, Take 5 mg by mouth., Disp: , Rfl:    cholecalciferol (VITAMIN D3) 25 MCG (1000 UNIT) tablet, Take 1,000 Units by mouth daily., Disp: , Rfl:    clindamycin (CLEOCIN) 300 MG capsule, Take 1 capsule (300 mg total) by mouth 3 (three) times daily., Disp: 21 capsule, Rfl: 0   cyclobenzaprine (FLEXERIL) 10 MG tablet, Take 1 tablet (10 mg total) by mouth 2 (two) times daily as needed for muscle spasms., Disp: 20 tablet, Rfl: 0   fluticasone (FLONASE) 50 MCG/ACT nasal spray, Place 1 spray into both nostrils daily., Disp: , Rfl:    gabapentin (NEURONTIN) 400 MG capsule, Take 400 mg by mouth 3 (three) times daily. Patient reports she is not taking currently, but has refills, Disp: , Rfl:    montelukast (SINGULAIR) 10 MG tablet, Take 10 mg by mouth daily. , Disp: , Rfl:    Multiple Vitamin  (MULTIVITAMIN WITH MINERALS) TABS tablet, Take 1 tablet by mouth daily., Disp: , Rfl:    pantoprazole (PROTONIX) 40 MG tablet, Take 40 mg by mouth daily., Disp: , Rfl:    QUEtiapine (SEROQUEL) 100 MG tablet, Take 1 tablet (100 mg total) by mouth at bedtime. Take '100mg'$  (1 pill for 3 days ) then increase to 2 pills ('200mg'$ )., Disp: 60 tablet, Rfl: 1   traMADol (ULTRAM) 50 MG tablet, Take 1 tablet (50 mg total) by mouth every 6 (six) hours as needed., Disp: 15 tablet, Rfl: 0   WIXELA INHUB 250-50 MCG/DOSE AEPB, Inhale 1 puff into the lungs 2 (two) times daily. , Disp: , Rfl:    Allergies  Allergen Reactions   Latex Anaphylaxis and Hives   Penicillins Anaphylaxis    Past Medical History:  Diagnosis Date   Arthritis    Asthma    Kidney stones    Seizures ([place])    One seizure years ago     Past Surgical History:  Procedure Laterality Date   KIDNEY STONE SURGERY     TUBAL LIGATION      No family history on file.  Social History   Tobacco Use   Smoking status: Some Days   Smokeless tobacco: Never  Substance Use Topics   Alcohol use: No   Drug use: No    ROS   Objective:   Vitals: BP 123/84 (BP Location: Left Arm)   Pulse 87  Temp 98.5 F (36.9 C) (Oral)   Resp 16   LMP  (Within Months)   SpO2 97%   Physical Exam Constitutional:      General: She is not in acute distress.    Appearance: Normal appearance. She is well-developed. She is not ill-appearing, toxic-appearing or diaphoretic.  HENT:     Head: Normocephalic and atraumatic.     Nose: Nose normal.     Mouth/Throat:     Mouth: Mucous membranes are moist.  Eyes:     General: No scleral icterus.       Right eye: No discharge.        Left eye: No discharge.     Extraocular Movements: Extraocular movements intact.  Cardiovascular:     Rate and Rhythm: Normal rate and regular rhythm.     Heart sounds: Normal heart sounds. No murmur heard.    No friction rub. No gallop.  Pulmonary:     Effort: Pulmonary  effort is normal. No respiratory distress.     Breath sounds: No stridor. No wheezing, rhonchi or rales.  Chest:     Chest wall: No tenderness.  Musculoskeletal:     Right forearm: Swelling and tenderness (medial forearm with associated swelling, warmth and superficial firmness extending ~10cm from proximal forearm) present. No edema, deformity, lacerations or bony tenderness.  Skin:    General: Skin is warm and dry.  Neurological:     General: No focal deficit present.     Mental Status: She is alert and oriented to person, place, and time.  Psychiatric:        Mood and Affect: Mood normal.        Behavior: Behavior normal.      Assessment and Plan :   PDMP not reviewed this encounter.  1. Pain of right forearm   2. History of DVT (deep vein thrombosis)     Discussed superficial thrombophlebitis versus dvt. Advised that she follow her PCP's recommendation to present to the emergency room as I cannot obtain an ultrasound to rule out aforementioned diagnoses. Patient verbalizes understanding, will present to the emergency room now by personal vehicle.    Jaynee Eagles, [place] 11/20/21 [phone]

## 2021-11-19 NOTE — ED Notes (Signed)
Patient is being discharged from the Urgent Care and sent to the Emergency Department via POV . Per [place], [place], patient is in need of higher level of care to rule out DVT. Patient is aware and verbalizes understanding of plan of care.  Vitals:   11/19/21 1942  BP: 123/84  Pulse: 87  Resp: 16  Temp: 98.5 F (36.9 C)  SpO2: 97%

## 2021-11-20 ENCOUNTER — Emergency Department (HOSPITAL_COMMUNITY): Payer: Medicare Other

## 2021-11-20 ENCOUNTER — Emergency Department (HOSPITAL_COMMUNITY)
Admission: EM | Admit: 2021-11-20 | Discharge: 2021-11-21 | Disposition: A | Discharge status: Home / Self Care | Payer: Medicare Other | Attending: Emergency Medicine | Admitting: Emergency Medicine

## 2021-11-20 DIAGNOSIS — R079 Chest pain, unspecified: Secondary | ICD-10-CM | POA: Diagnosis not present

## 2021-11-20 DIAGNOSIS — Z7901 Long term (current) use of anticoagulants: Secondary | ICD-10-CM | POA: Insufficient documentation

## 2021-11-20 DIAGNOSIS — R0602 Shortness of breath: Secondary | ICD-10-CM | POA: Diagnosis not present

## 2021-11-20 DIAGNOSIS — I82611 Acute embolism and thrombosis of superficial veins of right upper extremity: Secondary | ICD-10-CM | POA: Insufficient documentation

## 2021-11-20 DIAGNOSIS — R059 Cough, unspecified: Secondary | ICD-10-CM | POA: Insufficient documentation

## 2021-11-20 DIAGNOSIS — Z91040 Latex allergy status: Secondary | ICD-10-CM | POA: Insufficient documentation

## 2021-11-20 DIAGNOSIS — M79601 Pain in right arm: Secondary | ICD-10-CM | POA: Diagnosis present

## 2021-11-20 LAB — BASIC METABOLIC PANEL
Anion gap: 12 (ref 5–15)
BUN: 14 mg/dL (ref 6–20)
CO2: 21 mmol/L — ABNORMAL LOW (ref 22–32)
Calcium: 9.3 mg/dL (ref 8.9–10.3)
Chloride: 106 mmol/L (ref 98–111)
Creatinine, Ser: 0.94 mg/dL (ref 0.44–1.00)
GFR, Estimated: >60 mL/min (ref >60)
Glucose, Bld: 102 mg/dL — ABNORMAL HIGH (ref 70–99)
Potassium: 3.7 mmol/L (ref 3.5–5.1)
Sodium: 139 mmol/L (ref 135–145)

## 2021-11-20 LAB — CBC
HCT: 29.8 % — ABNORMAL LOW (ref 36.0–46.0)
Hemoglobin: 9.6 g/dL — ABNORMAL LOW (ref 12.0–15.0)
MCH: 24.7 pg — ABNORMAL LOW (ref 26.0–34.0)
MCHC: 32.2 g/dL (ref 30.0–36.0)
MCV: 76.8 fL — ABNORMAL LOW (ref 80.0–100.0)
Platelets: 228 10*3/uL (ref 150–400)
RBC: 3.88 MIL/uL (ref 3.87–5.11)
RDW: 19.9 % — ABNORMAL HIGH (ref 11.5–15.5)
WBC: 7.7 10*3/uL (ref 4.0–10.5)
nRBC: 0 % (ref 0.0–0.2)

## 2021-11-20 LAB — TROPONIN I (HIGH SENSITIVITY): Troponin I (High Sensitivity): 5 ng/L (ref <18)

## 2021-11-20 LAB — BRAIN NATRIURETIC PEPTIDE: B Natriuretic Peptide: 8.4 pg/mL (ref 0.0–100.0)

## 2021-11-20 LAB — D-DIMER, QUANTITATIVE: D-Dimer, Quant: 2.31 ug/mL-FEU — ABNORMAL HIGH (ref 0.00–0.50)

## 2021-11-20 MED ORDER — IOHEXOL 350 MG/ML SOLN
80.0000 mL | Freq: Once | INTRAVENOUS | Status: AC | PRN
  Administered 2021-11-20: 80 mL via INTRAVENOUS

## 2021-11-20 NOTE — ED Triage Notes (Addendum)
Pt c/o R arm pain that started Saturday. Hx DVT in lower extremity, not on anticoagulation. Associated SHOB, intermittent cough. Seen at UC last night for same, sent to ED for further eval & [place]. Pt also c/o excess salivation since yesterday. Able to control secretions, airway intact

## 2021-11-20 NOTE — ED Provider Triage Note (Signed)
Emergency Medicine Provider Triage Evaluation Note  Kendra Roberts , a 46 y.o. female  was evaluated in triage.  Pt complains of worsening swelling and pain of the right arm and new onset of shortness of breath.  For saw PCP, was recommended come to the ED for further testing/evaluation, however decided to go to UC yesterday.  From there was recommended to come to the ED for same.  Patient states had new onset of shortness of breath shortly around the time she went to UC.  With some chest pain, however denies N/V/D, abdominal pain, back pain, dizziness, or LOC.  Known Hx of prior DVT, not on anticoagulation.  Denies fevers however endorses chills.  Review of Systems  Positive:  Negative: See above  Physical Exam  BP 124/84 (BP Location: Left Arm)   Pulse 88   Temp 99.6 F (37.6 C)   Resp 18   Ht '5\' 6"'$  (1.676 m)   Wt 103 kg   LMP  (Within Months)   SpO2 100%   BMI 36.64 kg/m  Gen:   Awake, in no acute distress, appears uncomfortable Resp:  Increased effort, noticeable wheeze on exam globally, coughing with hoarse voice MSK:   Moves extremities without difficulty  Other:  No evidence of angioedema.  Significant tenderness with ROM and palpation of the right upper extremity with swelling and erythema appreciated.  Tenderness goes from the right fingertips all the way to the posterior aspect of the right shoulder and into the right chest wall.  Radial pulses 2+ bilaterally.  Medical Decision Making  Medically screening exam initiated at 7:46 PM.  Appropriate orders placed.  Kendra Roberts was informed that the remainder of the evaluation will be completed by another provider, this initial triage assessment does not replace that evaluation, and the importance of remaining in the ED until their evaluation is complete.  Respiratory rate of 20 appreciated on exam.  HR of 88.  Temp 99.6, afebrile.  D-dimer and CTA chest ordered.  US Doppler not available at this facility at this time.    Prince Rome, PA-C 69/67/89 [id number]

## 2021-11-21 ENCOUNTER — Emergency Department (HOSPITAL_BASED_OUTPATIENT_CLINIC_OR_DEPARTMENT_OTHER): Payer: Medicare Other

## 2021-11-21 DIAGNOSIS — M7989 Other specified soft tissue disorders: Secondary | ICD-10-CM

## 2021-11-21 DIAGNOSIS — R52 Pain, unspecified: Secondary | ICD-10-CM

## 2021-11-21 LAB — TROPONIN I (HIGH SENSITIVITY): Troponin I (High Sensitivity): 5 ng/L (ref <18)

## 2021-11-21 MED ORDER — TRAMADOL HCL 50 MG PO TABS: 50.0000 mg | ORAL_TABLET | Freq: Four times a day (QID) | ORAL | 0 refills | Status: AC | PRN

## 2021-11-21 MED ORDER — OXYCODONE-ACETAMINOPHEN 5-325 MG PO TABS
1.0000 | ORAL_TABLET | Freq: Once | ORAL | Status: AC
  Administered 2021-11-21: 1 via ORAL
  Filled 2021-11-21: qty 1

## 2021-11-21 MED ORDER — RIVAROXABAN 10 MG PO TABS: 10.0000 mg | ORAL_TABLET | Freq: Every day | ORAL | 0 refills | Status: AC

## 2021-11-21 NOTE — ED Provider Notes (Signed)
[facility] Provider Note   CSN: [id number] Arrival date & time: 11/20/21  1835     History  Chief Complaint  Patient presents with   Arm Pain    R   Shortness of Breath    Kendra Roberts is a 46 y.o. female who presents to the ED with concerns of right arm pain that began on Saturday.  She is having pain, redness, swelling in her right forearm that has been worsening despite use of cool and warm compresses and NSAIDs.  She has a history of DVT in lower extremity, not on anticoagulation.  She also complained of shortness of breath and intermittent cough.  She was seen at her PCP and urgent care for same problem and was recommended further evaluation in the ED. Endorses fever on Saturday.  Denies chest pain, leg swelling, palpitations, syncope, lightheadedness, recent surgery or travel.       Home Medications Prior to Admission medications   Medication Sig Start Date End Date Taking? Authorizing Provider  albuterol (PROVENTIL HFA;VENTOLIN HFA) 108 (90 BASE) MCG/ACT inhaler Inhale 2 puffs into the lungs every 6 (six) hours as needed for wheezing.    [provider]  albuterol (PROVENTIL) (2.5 MG/3ML) 0.083% nebulizer solution Take 2.5 mg by nebulization every 6 (six) hours as needed for wheezing or shortness of breath.    [provider]  AMLODIPINE BENZOATE PO Take 5 mg by mouth.    [provider]  cholecalciferol (VITAMIN D3) 25 MCG (1000 UNIT) tablet Take 1,000 Units by mouth daily.    [provider]  clindamycin (CLEOCIN) 300 MG capsule Take 1 capsule (300 mg total) by mouth 3 (three) times daily. 02/01/21   Varney Biles, MD  cyclobenzaprine (FLEXERIL) 10 MG tablet Take 1 tablet (10 mg total) by mouth 2 (two) times daily as needed for muscle spasms. 03/16/21   Jacelyn Pi, MD  fluticasone (FLONASE) 50 MCG/ACT nasal spray Place 1 spray into both nostrils daily. 01/15/21   [provider]   gabapentin (NEURONTIN) 400 MG capsule Take 400 mg by mouth 3 (three) times daily. Patient reports she is not taking currently, but has refills 04/17/19   [provider]  montelukast (SINGULAIR) 10 MG tablet Take 10 mg by mouth daily.  06/10/19   [provider]  Multiple Vitamin (MULTIVITAMIN WITH MINERALS) TABS tablet Take 1 tablet by mouth daily.    [provider]  pantoprazole (PROTONIX) 40 MG tablet Take 40 mg by mouth daily. 06/03/19   [provider]  QUEtiapine (SEROQUEL) 100 MG tablet Take 1 tablet (100 mg total) by mouth at bedtime. Take '100mg'$  (1 pill for 3 days ) then increase to 2 pills ('200mg'$ ). 10/23/21 10/23/22  Freida Busman, MD  rivaroxaban (XARELTO) 10 MG TABS tablet Take 1 tablet (10 mg total) by mouth daily. 11/21/21  Yes Leyanna Bittman R, PA  traMADol (ULTRAM) 50 MG tablet Take 1 tablet (50 mg total) by mouth every 6 (six) hours as needed. 11/21/21  Yes Pearlina Friedly R, PA  WIXELA INHUB 250-50 MCG/DOSE AEPB Inhale 1 puff into the lungs 2 (two) times daily.  04/19/19   [provider]      Allergies    Latex and Penicillins    Review of Systems   Review of Systems  Constitutional:  Positive for fever.  Respiratory:  Positive for shortness of breath.   Cardiovascular:  Negative for chest pain, palpitations and leg swelling.  Musculoskeletal:  Right forearm pain  Skin:  Positive for color change.       Redness to right forearm  Neurological:  Negative for syncope, weakness, light-headedness and headaches.    Physical Exam Updated Vital Signs BP 119/83 (BP Location: Right Arm)   Pulse 67   Temp 97.7 F (36.5 C) (Oral)   Resp 16   Ht '5\' 6"'$  (1.676 m)   Wt 103 kg   LMP  (Within Months)   SpO2 98%   BMI 36.64 kg/m  Physical Exam Vitals and nursing note reviewed.  Constitutional:      General: She is not in acute distress.    Appearance: Normal appearance. She is not ill-appearing or diaphoretic.  Cardiovascular:      Rate and Rhythm: Normal rate and regular rhythm.     Pulses: Normal pulses.     Heart sounds: Normal heart sounds.  Pulmonary:     Effort: Pulmonary effort is normal. No respiratory distress.     Breath sounds: Normal breath sounds and air entry.  Abdominal:     Palpations: Abdomen is soft.     Tenderness: There is no abdominal tenderness.  Musculoskeletal:     Right forearm: Swelling, edema and tenderness present.     Left forearm: Normal.     Right lower leg: Normal. No swelling or tenderness. No edema.     Left lower leg: Normal. No swelling or tenderness. No edema.     Comments: Erythematous induration of superficial vein along posterior right forearm.  Skin:    General: Skin is warm and dry.     Capillary Refill: Capillary refill takes less than 2 seconds.     Findings: No rash.  Neurological:     Mental Status: She is alert. Mental status is at baseline.  Psychiatric:        Mood and Affect: Mood normal.        Behavior: Behavior normal.     ED Results / Procedures / Treatments   Labs (all labs ordered are listed, but only abnormal results are displayed) Labs Reviewed  BASIC METABOLIC PANEL - Abnormal; Notable for the following components:      Result Value   CO2 21 (*)    Glucose, Bld 102 (*)    All other components within normal limits  CBC - Abnormal; Notable for the following components:   Hemoglobin 9.6 (*)    HCT 29.8 (*)    MCV 76.8 (*)    MCH 24.7 (*)    RDW 19.9 (*)    All other components within normal limits  D-DIMER, QUANTITATIVE - Abnormal; Notable for the following components:   D-Dimer, Quant 2.31 (*)    All other components within normal limits  BRAIN NATRIURETIC PEPTIDE  TROPONIN I (HIGH SENSITIVITY)  TROPONIN I (HIGH SENSITIVITY)    EKG None  Radiology UE VENOUS DUPLEX (7am - 7pm)  Result Date: 11/21/2021 UPPER VENOUS STUDY  Patient Name:  ATLEE VILLERS  Date of Exam:   11/21/2021 Medical Rec #: [id number]          Accession #:     [id number] Date of Birth: 08-Aug-1975          Patient Gender: F Patient Age:   23 years Exam Location:  [facility] Procedure:      VAS [place] UPPER EXTREMITY VENOUS DUPLEX Referring Phys: Dorise Bullion --------------------------------------------------------------------------------  Indications: Pain, and Swelling Comparison Study: No prior study Performing Technologist: Sharion Dove RVS  Examination Guidelines: A  complete evaluation includes B-mode imaging, spectral Doppler, color Doppler, and power Doppler as needed of all accessible portions of each vessel. Bilateral testing is considered an integral part of a complete examination. Limited examinations for reoccurring indications may be performed as noted.  Right Findings: +----------+------------+---------+-----------+----------+---------------------+ RIGHT     CompressiblePhasicitySpontaneousProperties       Summary        +----------+------------+---------+-----------+----------+---------------------+ IJV           Full       Yes       Yes                                    +----------+------------+---------+-----------+----------+---------------------+ Subclavian               Yes       Yes                                    +----------+------------+---------+-----------+----------+---------------------+ Axillary                 Yes       Yes                                    +----------+------------+---------+-----------+----------+---------------------+ Brachial      Full       Yes       Yes                                    +----------+------------+---------+-----------+----------+---------------------+ Radial        Full                                                        +----------+------------+---------+-----------+----------+---------------------+ Ulnar         Full                                                         +----------+------------+---------+-----------+----------+---------------------+ Cephalic      Full                                                        +----------+------------+---------+-----------+----------+---------------------+ Basilic       None                                    posterior forearm  from elbow to wrist  +----------+------------+---------+-----------+----------+---------------------+  Left Findings: +----------+------------+---------+-----------+----------+-------+ LEFT      CompressiblePhasicitySpontaneousPropertiesSummary +----------+------------+---------+-----------+----------+-------+ Subclavian               Yes       Yes                      +----------+------------+---------+-----------+----------+-------+  Summary:  Right: No evidence of deep vein thrombosis in the upper extremity. Findings consistent with acute superficial vein thrombosis involving the right basilic vein. SVT noted posterior forearm from elbow to wrist.  Left: No evidence of thrombosis in the subclavian.  *See table(s) above for measurements and observations.     Preliminary    CT Angio Chest PE W/Cm &/Or Wo Cm  Result Date: 11/20/2021 CLINICAL DATA:  Right arm pain that started Saturday. Shortness of breath. Intermittent cough. EXAM: CT ANGIOGRAPHY CHEST WITH CONTRAST TECHNIQUE: Multidetector CT imaging of the chest was performed using the standard protocol during bolus administration of intravenous contrast. Multiplanar CT image reconstructions and MIPs were obtained to evaluate the vascular anatomy. RADIATION DOSE REDUCTION: This exam was performed according to the departmental dose-optimization program which includes automated exposure control, adjustment of the mA and/or kV according to patient size and/or use of iterative reconstruction technique. CONTRAST:  35m OMNIPAQUE IOHEXOL 350 MG/ML SOLN COMPARISON:  Chest  radiograph performed earlier on the same date. FINDINGS: Cardiovascular: Satisfactory opacification of the pulmonary arteries to the segmental level. No evidence of pulmonary embolism. Normal heart size. No pericardial effusion. Mediastinum/Nodes: No enlarged mediastinal, hilar, or axillary lymph nodes. Thyroid gland, trachea, and esophagus demonstrate no significant findings. Lungs/Pleura: Lungs are clear. No pleural effusion or pneumothorax. Upper Abdomen: No acute abnormality. Musculoskeletal: No chest wall abnormality. No acute or significant osseous findings. Review of the MIP images confirms the above findings. IMPRESSION: No evidence of pulmonary embolism or acute intrathoracic process. Electronically Signed   By: IKeane PoliceD.O.   On: 11/20/2021 22:38   DG Chest 2 View  Result Date: 11/20/2021 CLINICAL DATA:  Shortness of breath.  Intermittent cough. EXAM: CHEST - 2 VIEW COMPARISON:  Chest two views 03/16/2021 FINDINGS: Cardiac silhouette and mediastinal contours are within normal limits. The lungs are clear. No pleural effusion or pneumothorax. Mild-to-moderate multilevel degenerative disc changes of the thoracic spine. Cholecystectomy clips. IMPRESSION: No active cardiopulmonary disease. Electronically Signed   By: RYvonne KendallM.D.   On: 11/20/2021 20:59    Procedures Procedures    Medications Ordered in ED Medications  iohexol (OMNIPAQUE) 350 MG/ML injection 80 mL (80 mLs Intravenous Contrast Given 11/20/21 2231)  oxyCODONE-acetaminophen (PERCOCET/ROXICET) 5-325 MG per tablet 1 tablet (1 tablet Oral Given 11/21/21 07824    ED Course/ Medical Decision Making/ A&P Clinical Course as of 11/21/21 1107  Thu Nov 21, 2021  0903 UE VENOUS DUPLEX (7am - 7pm) Notified by tech that pt is negative for DVT of UE but she does have superficial thrombus in the forearm elbow to mid forearm.  [WF]    Clinical Course User Index [WF] FTedd Sias PA                           Medical  Decision Making Amount and/or Complexity of Data Reviewed Labs: ordered. Decision-making details documented in ED Course. Radiology: ordered. Decision-making details documented in ED Course. ECG/medicine tests: ordered and independent interpretation performed. Decision-making details documented in ED Course.  Risk OTC drugs. Prescription drug management.  Patient presents to the ED with concerns of right forearm pain, redness and swelling that has been persistent since Saturday.  She has a history of DVT is not currently on anticoagulation.  She has been trying heating pads, NSAIDs, and elevation without relief.  She was seen at PCP and urgent care for same and was advised to proceed to ED for further evaluation and ultrasound.  Differential diagnoses but are not limited to include DVT, PE, superficial venous thrombosis, phlebitis, cellulitis.  Laboratory work-up and imaging were ordered and personally reviewed/interpreted. CXR reveals no active cardiopulmonary disease or evidence of PE.  CT angio of chest is negative for PE.  Ultrasound venous duplex of right arm reveals no DVT, findings consistent with superficial venous thrombosis involving right basilic vein, noted from wrist to forearm.  Physical exam reveals exquisite tenderness to right posterior forearm, indurated superficial vein with surrounding erythema.  She is not in respiratory distress, lung sounds clear to auscultation bilaterally.  Heart rate normal, regular rhythm.  No edema, erythema, or tenderness to bilateral lower extremities.   Physical exam and imaging are consistent with superficial venous thrombophlebitis.  I do not feel that there is associated cellulitis requiring antibiotic treatment at this time.  Patient has been using NSAIDs and heating pad without relief at home.  Based on previous history of DVT, superficial vein thrombosis that is affecting vein segment from wrist to forearm, and worsening symptoms despite  symptomatic care I feel patient is an intermediate risk for VTE and prophylactic anticoagulation is required.  Patient is hemodynamically stable, has no evidence of PE or DVT, and is appropriate for discharge.   I will discharge patient with 45-day prescription of rivaroxaban 10 mg for anticoagulation prophylactic.  Will also provide patient with tramadol for severe pain.  Advised patient to follow-up as soon as possible with PCP.  Discussed with patient continued symptomatic care at home, avoidance of NSAIDs while taking anticoagulant, signs and symptoms that require immediate return to ED for further evaluation.  Discussed HPI, physical exam, assessment and plan with attending Varney Biles who agrees with plan.         Final Clinical Impression(s) / ED Diagnoses Final diagnoses:  Superficial venous thrombosis of arm, right    Rx / DC Orders ED Discharge Orders          Ordered    rivaroxaban (XARELTO) 10 MG TABS tablet  Daily        11/21/21 1026    traMADol (ULTRAM) 50 MG tablet  Every 6 hours PRN        11/21/21 [address] [place], [place] 11/21/21 1107    Varney Biles, MD 11/22/21 [phone]

## 2021-11-21 NOTE — Discharge Instructions (Addendum)
You were seen in the ER for right arm pain, your imaging is consistent with superficial vein thrombophlebitis.  There was no evidence of a deep clot in your arm or a clot in your lungs.  I am sending you home on a blood thinner called Xarelto.  You will take this for 45 days.  Please follow-up with your PCP in the next week.  Do not take NSAIDs while you are on this medication.  You may take Tylenol for pain or fever.  Continue to use heating pad on your arm.  I have attached information.  Return to the ER if you develop new or worsening symptoms.

## 2021-11-21 NOTE — ED Notes (Signed)
Pt stated her arm hurts and would like to have something for pain. Triage nurse Annamary Carolin, RN) notified.

## 2021-11-21 NOTE — Progress Notes (Signed)
VASCULAR LAB    Left upper extremity venous duplex has been performed.  See CV proc for preliminary results.  Gave verbal report to PA in triage  Makena Murdock, RVT 11/21/2021, 9:08 AM

## 2021-12-02 ENCOUNTER — Telehealth (HOSPITAL_COMMUNITY): Payer: Medicare Other | Admitting: Student in an Organized Health Care Education/Training Program

## 2021-12-04 ENCOUNTER — Telehealth (HOSPITAL_COMMUNITY): Payer: Self-pay | Admitting: *Deleted

## 2021-12-04 NOTE — Telephone Encounter (Signed)
Pt pharmacy, [facility], called per pt request asking that all meds be refilled. The only medication prescribed by you is Seroquel 100 mg (200 mg total qd), last sent on 10/23/21 with 1 refill. Pt has an appointment scheduled for 12/10/21 with you. Pt should have enough medication until appointment, if not is it ok to send a bridge?

## 2021-12-05 ENCOUNTER — Other Ambulatory Visit (HOSPITAL_COMMUNITY): Payer: Self-pay | Admitting: Student in an Organized Health Care Education/Training Program

## 2021-12-05 DIAGNOSIS — F250 Schizoaffective disorder, bipolar type: Secondary | ICD-10-CM

## 2021-12-05 MED ORDER — QUETIAPINE FUMARATE 200 MG PO TABS: 200.0000 mg | ORAL_TABLET | Freq: Every day | ORAL | 0 refills | Status: DC

## 2021-12-05 NOTE — Telephone Encounter (Signed)
I sent in a refill of Seroquel to get her to the next appt.

## 2021-12-09 ENCOUNTER — Telehealth (HOSPITAL_BASED_OUTPATIENT_CLINIC_OR_DEPARTMENT_OTHER): Payer: Medicare Other | Admitting: Student in an Organized Health Care Education/Training Program

## 2021-12-09 ENCOUNTER — Encounter (HOSPITAL_COMMUNITY): Payer: Self-pay | Admitting: Student in an Organized Health Care Education/Training Program

## 2021-12-09 DIAGNOSIS — F250 Schizoaffective disorder, bipolar type: Secondary | ICD-10-CM

## 2021-12-09 MED ORDER — QUETIAPINE FUMARATE 400 MG PO TABS: 400.0000 mg | ORAL_TABLET | Freq: Every day | ORAL | 2 refills | Status: DC

## 2021-12-09 NOTE — Progress Notes (Signed)
[place] MD/PA/NP OP Progress Note  12/09/2021 7:44 PM Kendra Roberts  MRN:  [id number]  Chief Complaint:  Chief Complaint  Patient presents with   Follow-up   HPI: Virtual Visit via Video Note  I connected with Kendra Roberts on 12/09/21 at  4:00 PM EST by a video enabled telemedicine application and verified that I am speaking with the correct person using two identifiers.  Location: Patient: Home-outside, ok to do assessment says she is alone, but neighbor came by during assessment but patient wanted to continue Provider: Office   I discussed the limitations of evaluation and management by telemedicine and the availability of in person appointments. The patient expressed understanding and agreed to proceed.  History of Present Illness: Kendra Roberts is a 46 yo patient w/ self- reported PPH of schizoaffective disorder, bipolar type, ADHD dx in childhood,  and PTSD and a PMH of TBI age 45 yo, hematuria w/ oncological concern current, sleep apnea?, and migraines. Patient reports she has been compliant with her Seroquel 223m QHS.  [facility] and has no intention of stopping. Patient reports she is still working on discontinuing her cigarette use, but feels she can do this without assistance( would be gum due to DVT). Patient reports that she has been "off the f--- chain!" Since the last visit. Patient does not give details despite provider asking multiple times instead patient reports "you know me!" And "your're black you know what I mean." Patient reports that she has not been sleeping well and has been more irritable. Patient reports she has been "talking more sh**" lately but is not getting in fights. Provider notes that patient seems very different and less dysphoric to which patient reports "oh you must have met Kendra Roberts last time." Patient denies SI, HI and endorses continued chronic AVH.   Patient reports that she recalls her journals being used  to help diagnose her with Multiple Personalities when she was younger. Patient reports that her provider at the time noticed her hand writing would change. Patient also mentioned that another personality named "Kendra Roberts exist.       I discussed the assessment and treatment plan with the patient. The patient was provided an opportunity to ask questions and all were answered. The patient agreed with the plan and demonstrated an understanding of the instructions.   The patient was advised to call back or seek an in-person evaluation if the symptoms worsen or if the condition fails to improve as anticipated.  I provided 25 minutes of non-face-to-face time during this encounter.   JFreida Busman MD  Visit Diagnosis:    ICD-10-CM   1. Schizoaffective disorder, bipolar type ([facility]  F25.0 QUEtiapine (SEROQUEL) 400 MG tablet      Past Psychiatric History:   Schizoaffective, bipolar type, PTSD, ADHD, Learning disabilities   INPT: Multiple- [facility] OUTPT: Multiple Not currently on medications, but was last on Zoloft. Hx meds: Zoloft, Seroquel (sedating), Haldol (sedating), Zyprexa, Abilify, Prozac, Effexor, Celexa   Patient reports a hx of dyslexia "I read backwards" and teacher concern for her learning development when she was in middle school. Patient reports that teacher's concern led to her introduction to the mental health system.   Last Visit: 09/2021: Started on Seroquel 2064mQHS  Past Medical History:  Past Medical History:  Diagnosis Date   Arthritis    Asthma    Kidney stones    [place]   One seizure years  ago    Past Surgical History:  Procedure Laterality Date   KIDNEY STONE SURGERY     TUBAL LIGATION      Family Psychiatric History: Mother: Schizoaffective, bipolar type Brother: "same issues as me."  Family History: No family history on file.  Social History:  Social History   Socioeconomic History   Marital status: Single    Spouse  name: Not on file   Number of children: Not on file   Years of education: Not on file   Highest education level: Not on file  Occupational History   Not on file  Tobacco Use   Smoking status: Some Days   Smokeless tobacco: Never  Substance and Sexual Activity   Alcohol use: No   Drug use: No   Sexual activity: Yes    Birth control/protection: Surgical  Other Topics Concern   Not on file  Social History Narrative   Not on file   Social Determinants of Health   Financial Resource Strain: Not on file  Food Insecurity: Not on file  Transportation Needs: Not on file  Physical Activity: Not on file  Stress: Not on file  Social Connections: Not on file    Allergies:  Allergies  Allergen Reactions   Latex Anaphylaxis and Hives   Penicillins Anaphylaxis    Metabolic Disorder Labs: No results found for: "HGBA1C", "MPG" Lab Results  Component Value Date   PROLACTIN 8.6 04/19/2009   No results found for: "CHOL", "TRIG", "HDL", "CHOLHDL", "VLDL", "LDLCALC" Lab Results  Component Value Date   TSH 1.705 04/19/2009   TSH 1.826 02/27/2009    Therapeutic Level Labs: No results found for: "LITHIUM" No results found for: "VALPROATE" No results found for: "CBMZ"  Current Medications: Current Outpatient Medications  Medication Sig Dispense Refill   albuterol (PROVENTIL HFA;VENTOLIN HFA) 108 (90 BASE) MCG/ACT inhaler Inhale 2 puffs into the lungs every 6 (six) hours as needed for wheezing.     albuterol (PROVENTIL) (2.5 MG/3ML) 0.083% nebulizer solution Take 2.5 mg by nebulization every 6 (six) hours as needed for wheezing or shortness of breath.     AMLODIPINE BENZOATE PO Take 5 mg by mouth.     cholecalciferol (VITAMIN D3) 25 MCG (1000 UNIT) tablet Take 1,000 Units by mouth daily.     clindamycin (CLEOCIN) 300 MG capsule Take 1 capsule (300 mg total) by mouth 3 (three) times daily. 21 capsule 0   cyclobenzaprine (FLEXERIL) 10 MG tablet Take 1 tablet (10 mg total) by mouth 2  (two) times daily as needed for muscle spasms. 20 tablet 0   fluticasone (FLONASE) 50 MCG/ACT nasal spray Place 1 spray into both nostrils daily.     gabapentin (NEURONTIN) 400 MG capsule Take 400 mg by mouth 3 (three) times daily. Patient reports she is not taking currently, but has refills     montelukast (SINGULAIR) 10 MG tablet Take 10 mg by mouth daily.      Multiple Vitamin (MULTIVITAMIN WITH MINERALS) TABS tablet Take 1 tablet by mouth daily.     pantoprazole (PROTONIX) 40 MG tablet Take 40 mg by mouth daily.     QUEtiapine (SEROQUEL) 400 MG tablet Take 1 tablet (400 mg total) by mouth at bedtime. Take 144m (1 pill for 3 days ) then increase to 2 pills (2050m. 30 tablet 2   rivaroxaban (XARELTO) 10 MG TABS tablet Take 1 tablet (10 mg total) by mouth daily. 45 tablet 0   traMADol (ULTRAM) 50 MG tablet Take 1 tablet (50 mg  total) by mouth every 6 (six) hours as needed. 10 tablet 0   WIXELA INHUB 250-50 MCG/DOSE AEPB Inhale 1 puff into the lungs 2 (two) times daily.      No current facility-administered medications for this visit.     Musculoskeletal: Defer Psychiatric Specialty Exam: Review of Systems  Psychiatric/Behavioral:  Positive for agitation, behavioral problems, hallucinations and sleep disturbance. Negative for dysphoric mood and suicidal ideas.     There were no vitals taken for this visit.There is no height or weight on file to calculate BMI.  General Appearance:  Face done in neat brown skin tone makeup,including brown matte lipstick, dreads are also dyed brownish  Eye Contact:  Poor  Speech:  Clear and Coherent  Volume:  Increased  Mood:  Irritable  Affect:  Congruent  Thought Process:  Goal Directed  Orientation:  Full (Time, Place, and Person)  Thought Content: WDL   Suicidal Thoughts:  No  Homicidal Thoughts:  No  Memory:  Immediate;   Good Recent;   Good Remote;   Good  Judgement:  Poor  Insight:  Shallow  Psychomotor Activity:  NA  Concentration:   Concentration: Poor  Recall:  NA  Fund of Knowledge: Poor  Language: Fair  Akathisia:  No  Handed:    AIMS (if indicated): not done  Assets:  Housing Resilience Social Support Transportation  ADL's:  Intact  Cognition: WNL  Sleep:  Poor   Screenings: [facility] ED from 11/20/2021 in [place] [facility] from 08/25/2021 in [facility] [facility] ED from 03/16/2021 in [facility] No Risk No Risk No Risk        Assessment and Plan:  Dalyn Kjos is a 46 yo patient w/ self- reported PPH of schizoaffective disorder, bipolar type, ADHD dx in childhood,  and PTSD and a PMH of TBI age 89 yo, hematuria w/ oncological concern current, sleep apnea?, and migraines.  Patient assessment today was very different compared to last. Patient appeared to have on makeup and appeared to be less dysphoric at last visit. Provider wondered before it was confirmed by patient if this was a different "personality." Patient was aggressive, louder, cussing at her dog and shouting. Patient's neighbor came through and around patient and endorsed shock that patient was behaving this way while on video call with provider. Patient referred to herself appropriately in the first person, and only notified provider once that she was "Margreta Journey" and not "Aneth" when provider confirmed that patient was not as irritable at her last visit patient said "oh that must have been Piqua you met." Patient also mentioned the name of another personality Waunita Roberts she did not before. In summary patient was much more distracted today with significant irritability and endorsed some problems with behavior, but never gave details. Will increase Seroquel and have asked patient to come in person now that she is able and to bring her journals.   Chronic PTSD Schizoaffective bipolar type ( R/ O Bipolar disorder) Possible Multiple  Personality Disorder vs General Personality disorder - Increase Seroquel to 448m QHS  F/u in 6 weeks in person   Collaboration of Care: Collaboration of Care:   Patient/Guardian was advised Release of Information must be obtained prior to any record release in order to collaborate their care with an outside provider. Patient/Guardian was advised if they have not already done so to contact the registration department to sign all necessary forms in order  for [place] to release information regarding their care.   Consent: Patient/Guardian gives verbal consent for treatment and assignment of benefits for services provided during this visit. Patient/Guardian expressed understanding and agreed to proceed.   PGY-3 Freida Busman, MD 12/09/2021, 7:44 PM

## 2022-01-02 DIAGNOSIS — K219 Gastro-esophageal reflux disease without esophagitis: Secondary | ICD-10-CM | POA: Diagnosis not present

## 2022-01-02 DIAGNOSIS — D508 Other iron deficiency anemias: Secondary | ICD-10-CM | POA: Diagnosis not present

## 2022-01-02 DIAGNOSIS — E782 Mixed hyperlipidemia: Secondary | ICD-10-CM | POA: Diagnosis not present

## 2022-01-02 DIAGNOSIS — Z0001 Encounter for general adult medical examination with abnormal findings: Secondary | ICD-10-CM | POA: Diagnosis not present

## 2022-01-02 DIAGNOSIS — I10 Essential (primary) hypertension: Secondary | ICD-10-CM | POA: Diagnosis not present

## 2022-01-02 DIAGNOSIS — R569 Unspecified convulsions: Secondary | ICD-10-CM | POA: Diagnosis not present

## 2022-01-02 DIAGNOSIS — R7303 Prediabetes: Secondary | ICD-10-CM | POA: Diagnosis not present

## 2022-01-02 DIAGNOSIS — J45909 Unspecified asthma, uncomplicated: Secondary | ICD-10-CM | POA: Diagnosis not present

## 2022-02-03 ENCOUNTER — Encounter (HOSPITAL_COMMUNITY): Payer: Self-pay | Admitting: Student in an Organized Health Care Education/Training Program

## 2022-02-03 ENCOUNTER — Telehealth (HOSPITAL_BASED_OUTPATIENT_CLINIC_OR_DEPARTMENT_OTHER): Payer: Medicare Other | Admitting: Student in an Organized Health Care Education/Training Program

## 2022-02-03 DIAGNOSIS — F250 Schizoaffective disorder, bipolar type: Secondary | ICD-10-CM

## 2022-02-03 DIAGNOSIS — F4312 Post-traumatic stress disorder, chronic: Secondary | ICD-10-CM

## 2022-02-03 MED ORDER — QUETIAPINE FUMARATE 400 MG PO TABS: 400.0000 mg | ORAL_TABLET | Freq: Every day | ORAL | 2 refills | Status: DC

## 2022-02-03 MED ORDER — LAMOTRIGINE 25 MG PO TABS: ORAL_TABLET | ORAL | 1 refills | Status: DC

## 2022-02-03 NOTE — Progress Notes (Signed)
[place] MD/PA/NP OP Progress Note  02/03/2022 6:44 PM ANUM PALECEK  MRN:  [id number]  Chief Complaint:  Chief Complaint  Patient presents with   Follow-up   Virtual Visit via Video Note  I connected with Alease Frame on 02/03/22 at  1:00 PM EST by a video enabled telemedicine application and verified that I am speaking with the correct person using two identifiers.  Location: Patient: Home Provider: [facility]   I discussed the limitations of evaluation and management by telemedicine and the availability of in person appointments. The patient expressed understanding and agreed to proceed.  History of Present Illness:   Kendra Roberts is a 47 yo patient w/ self- reported PPH of schizoaffective disorder, bipolar type, ADHD dx in childhood,  and PTSD and a PMH of TBI age 47 yo, hematuria w/ oncological concern current, sleep apnea?, and migraines. Patient reports she has been compliant with her Seroquel '400mg'$  QHS.   Objectively, today is the first time patient does visit from the inside of her home.  Patient appeared much more focused during this assessment than either of her previous 2.  Patient reports that she is not doing well because her landlord will likely be kicking her out of the home around 5 PM today due to the rent being unpaid.  Patient denies having SI or HI.  Patient reports that due to the social stressors her anxiety and mood have been worse.  Patient reports she is not eating real food, but has increased eating dirt.  Patient reports that she is eating dirt all of her life and finds it comforting.  Patient reports that she has noticed in the past that she will tend to eat more dirt when she is stressed out reporting that it can be comforting for her.  Patient reports that dirt reminds her of cake, and she has a way that she bakes it in the sun to "clean it."  Patient reports she is also aware that she has low iron but does not care very much about this anymore as this has been  a chronic concern.  Patient reports that she continues to have visual hallucinations of her grandmother, but she finds them comforting and has also noticed they have increased in frequency since her recent financial struggle.  Patient reports that she continues to have auditory hallucinations of hearing multiple people talking about different things in her head.  Patient denies that it is scary and endorses that again this is also comforting.  Patient reports that she does find some of the voices recognizable as people that she found comforting in her past.  Patient reports that she has also been hearing the voice of one of her ex partners recently.  Patient reports that she has found herself thinking about this person more and endorses that she remembers feeling comfortable around this individual.  Patient endorses that she continues to struggle with feeling anxious around large groups of people.  Patient reports that initially she forgot she was supposed to come in person and was shocked that her visit was not video.  However once it was converted over to a video visit patient endorsed that she may recall making this decision, and reports that she would like to try again since the provider mentioned that there are not many people in the office.   I discussed the assessment and treatment plan with the patient. The patient was provided an opportunity to ask questions and all were answered. The patient agreed with  the plan and demonstrated an understanding of the instructions.   The patient was advised to call back or seek an in-person evaluation if the symptoms worsen or if the condition fails to improve as anticipated.  I provided 30 minutes of non-face-to-face time during this encounter.   Freida Busman, MD  Visit Diagnosis:    ICD-10-CM   1. Chronic post-traumatic stress disorder (PTSD)  F43.12 lamoTRIgine (LAMICTAL) 25 MG tablet    QUEtiapine (SEROQUEL) 400 MG tablet    2. Schizoaffective  disorder, bipolar type (H[facility])  F25.0 lamoTRIgine (LAMICTAL) 25 MG tablet    QUEtiapine (SEROQUEL) 400 MG tablet      Past Psychiatric History:  Schizoaffective, bipolar type, PTSD, ADHD, Learning disabilities   INPT: Multiple- [facility] OUTPT: Multiple Not currently on medications, but was last on Zoloft. Hx meds: Zoloft, Seroquel (sedating), Haldol (sedating), Zyprexa, Abilify, Prozac, Effexor, Celexa   Patient reports a hx of dyslexia "I read backwards" and teacher concern for her learning development when she was in middle school. Patient reports that teacher's concern led to her introduction to the mental health system.   Last Visit: 09/2021: Started on Seroquel '200mg'$  QHS 11/2021: Patient was much more explosive and personality, and claimed to be a different personality than the person that the provider met at the initial visit.  Patient Seroquel was increased to 400 mg nightly.  Past Medical History:  Past Medical History:  Diagnosis Date   Arthritis    Asthma    Kidney stones    Seizures ([place])    One seizure years ago    Past Surgical History:  Procedure Laterality Date   KIDNEY STONE SURGERY     TUBAL LIGATION      Family Psychiatric History:  Mother: Schizoaffective, bipolar type Brother: "same issues as me."  Family History: History reviewed. No pertinent family history.  Social History:  Social History   Socioeconomic History   Marital status: Single    Spouse name: Not on file   Number of children: Not on file   Years of education: Not on file   Highest education level: Not on file  Occupational History   Not on file  Tobacco Use   Smoking status: Some Days   Smokeless tobacco: Never  Substance and Sexual Activity   Alcohol use: No   Drug use: No   Sexual activity: Yes    Birth control/protection: Surgical  Other Topics Concern   Not on file  Social History Narrative   Not on file   Social Determinants of Health   Financial  Resource Strain: Not on file  Food Insecurity: Not on file  Transportation Needs: Not on file  Physical Activity: Not on file  Stress: Not on file  Social Connections: Not on file    Allergies:  Allergies  Allergen Reactions   Latex Anaphylaxis and Hives   Penicillins Anaphylaxis    Metabolic Disorder Labs: No results found for: "HGBA1C", "MPG" Lab Results  Component Value Date   PROLACTIN 8.6 04/19/2009   No results found for: "CHOL", "TRIG", "HDL", "CHOLHDL", "VLDL", "LDLCALC" Lab Results  Component Value Date   TSH 1.705 04/19/2009   TSH 1.826 02/27/2009    Therapeutic Level Labs: No results found for: "LITHIUM" No results found for: "VALPROATE" No results found for: "CBMZ"  Current Medications: Current Outpatient Medications  Medication Sig Dispense Refill   lamoTRIgine (LAMICTAL) 25 MG tablet Take 1 tablet (25 mg total) by mouth daily for 14 days, THEN  2 tablets (50 mg total) daily. Take '25mg'$  for 2 weeks then increase to '50mg'$  daily ( 2 pills). 60 tablet 1   albuterol (PROVENTIL HFA;VENTOLIN HFA) 108 (90 BASE) MCG/ACT inhaler Inhale 2 puffs into the lungs every 6 (six) hours as needed for wheezing.     albuterol (PROVENTIL) (2.5 MG/3ML) 0.083% nebulizer solution Take 2.5 mg by nebulization every 6 (six) hours as needed for wheezing or shortness of breath.     AMLODIPINE BENZOATE PO Take 5 mg by mouth.     cholecalciferol (VITAMIN D3) 25 MCG (1000 UNIT) tablet Take 1,000 Units by mouth daily.     clindamycin (CLEOCIN) 300 MG capsule Take 1 capsule (300 mg total) by mouth 3 (three) times daily. 21 capsule 0   cyclobenzaprine (FLEXERIL) 10 MG tablet Take 1 tablet (10 mg total) by mouth 2 (two) times daily as needed for muscle spasms. 20 tablet 0   fluticasone (FLONASE) 50 MCG/ACT nasal spray Place 1 spray into both nostrils daily.     gabapentin (NEURONTIN) 400 MG capsule Take 400 mg by mouth 3 (three) times daily. Patient reports she is not taking currently, but has  refills     montelukast (SINGULAIR) 10 MG tablet Take 10 mg by mouth daily.      Multiple Vitamin (MULTIVITAMIN WITH MINERALS) TABS tablet Take 1 tablet by mouth daily.     pantoprazole (PROTONIX) 40 MG tablet Take 40 mg by mouth daily.     QUEtiapine (SEROQUEL) 400 MG tablet Take 1 tablet (400 mg total) by mouth at bedtime. Take '100mg'$  (1 pill for 3 days ) then increase to 2 pills ('200mg'$ ). 30 tablet 2   rivaroxaban (XARELTO) 10 MG TABS tablet Take 1 tablet (10 mg total) by mouth daily. 45 tablet 0   traMADol (ULTRAM) 50 MG tablet Take 1 tablet (50 mg total) by mouth every 6 (six) hours as needed. 10 tablet 0   WIXELA INHUB 250-50 MCG/DOSE AEPB Inhale 1 puff into the lungs 2 (two) times daily.      No current facility-administered medications for this visit.     Musculoskeletal: Defer  Psychiatric Specialty Exam: Review of Systems  Constitutional:  Positive for appetite change.  Psychiatric/Behavioral:  Positive for dysphoric mood and hallucinations. Negative for sleep disturbance and suicidal ideas.     There were no vitals taken for this visit.There is no height or weight on file to calculate BMI.  General Appearance: Casual  Eye Contact:  Good  Speech:  Clear and Coherent  Volume:  Normal  Mood:  Dysphoric  Affect:  Appropriate occasionally tearful while talking about possibly being evicted  Thought Process:  Coherent  Orientation:  Full (Time, Place, and Person)  Thought Content: Logical   Suicidal Thoughts:  No  Homicidal Thoughts:  No  Memory:  Immediate;   Good Recent;   Fair  Judgement:  Fair  Insight:  Shallow  Psychomotor Activity:  Normal  Concentration:  Concentration: Good  Recall:  NA  Fund of Knowledge: Fair  Language: Good  Akathisia:  No  Handed:    AIMS (if indicated): not done  Assets:  Communication Skills Desire for Improvement Housing Resilience  ADL's:  Intact  Cognition: WNL  Sleep:  Good   Screenings: [place] ED from 11/20/2021 in  [facility] ED from 08/25/2021 in [place] DEPT ED from 03/16/2021 in [place] No Risk No Risk No Risk  Assessment and Plan:  rystal Grossi is a 47 yo patient w/ self- reported PPH of schizoaffective disorder, bipolar type, ADHD dx in childhood,  and PTSD and a PMH of TBI age 26 yo, hematuria w/ oncological concern current, sleep apnea?, and migraines. Based on assessment today patient endorses having more issues related to PTSD and poor coping skills.  Today's assessment suggest that patient has multiple immature coping mechanisms such as creating personalities or voices of others from her past and her head that she previously found comfortable.  Patient's increased her eating could also be more related to her stressors than her iron deficiency.  Patient was a bit more forthcoming about identifying what behaviors are exacerbated by stress during this visit.  Unfortunately, the patient continues to endorse true history of mania limiting medications available to treat her PTSD related symptoms.  Patient also does not appear to be a good candidate for an alpha-2 agonist or beta-blocker based on her recent vitals.  Due to this, decision was made to start patient on a low dose of Lamictal.  Depakote was again not chosen due to patient reluctance to come in person or go out in public due to social anxiety, which would likely lead to her not getting lab work done and poor monitoring. Chronic PTSD Schizoaffective bipolar type ( R/ O Bipolar disorder) Possible Multiple Personality Disorder (decreased concern 02/03/2022) vs General Personality disorder -Continue Seroquel to '400mg'$  QHS - Start Lamictal 25 mg x 14 days and increase to 50 mg daily   F/u in 6 weeks in person Collaboration of Care: Collaboration of Care:   Patient/Guardian was advised Release of Information must  be obtained prior to any record release in order to collaborate their care with an outside provider. Patient/Guardian was advised if they have not already done so to contact the registration department to sign all necessary forms in order for [place] to release information regarding their care.   Consent: Patient/Guardian gives verbal consent for treatment and assignment of benefits for services provided during this visit. Patient/Guardian expressed understanding and agreed to proceed.   PGY-3 Freida Busman, MD 02/03/2022, 6:44 PM

## 2022-02-05 NOTE — Addendum Note (Signed)
Addended by: Charlette Caffey on: 02/05/2022 01:48 PM   Modules accepted: Level of Service

## 2022-02-17 ENCOUNTER — Other Ambulatory Visit (HOSPITAL_COMMUNITY): Payer: Self-pay | Admitting: Student in an Organized Health Care Education/Training Program

## 2022-02-17 DIAGNOSIS — F4312 Post-traumatic stress disorder, chronic: Secondary | ICD-10-CM

## 2022-02-17 DIAGNOSIS — F250 Schizoaffective disorder, bipolar type: Secondary | ICD-10-CM

## 2022-02-17 MED ORDER — QUETIAPINE FUMARATE 400 MG PO TABS: 400.0000 mg | ORAL_TABLET | Freq: Every day | ORAL | 2 refills | Status: DC

## 2022-02-21 DIAGNOSIS — R7303 Prediabetes: Secondary | ICD-10-CM | POA: Diagnosis not present

## 2022-02-21 DIAGNOSIS — D508 Other iron deficiency anemias: Secondary | ICD-10-CM | POA: Diagnosis not present

## 2022-02-21 DIAGNOSIS — M25512 Pain in left shoulder: Secondary | ICD-10-CM | POA: Diagnosis not present

## 2022-02-21 DIAGNOSIS — K219 Gastro-esophageal reflux disease without esophagitis: Secondary | ICD-10-CM | POA: Diagnosis not present

## 2022-02-21 DIAGNOSIS — J45909 Unspecified asthma, uncomplicated: Secondary | ICD-10-CM | POA: Diagnosis not present

## 2022-02-21 DIAGNOSIS — R569 Unspecified convulsions: Secondary | ICD-10-CM | POA: Diagnosis not present

## 2022-02-21 DIAGNOSIS — I10 Essential (primary) hypertension: Secondary | ICD-10-CM | POA: Diagnosis not present

## 2022-02-21 DIAGNOSIS — N3000 Acute cystitis without hematuria: Secondary | ICD-10-CM | POA: Diagnosis not present

## 2022-02-21 DIAGNOSIS — E782 Mixed hyperlipidemia: Secondary | ICD-10-CM | POA: Diagnosis not present

## 2022-03-07 DIAGNOSIS — I10 Essential (primary) hypertension: Secondary | ICD-10-CM | POA: Diagnosis not present

## 2022-03-07 DIAGNOSIS — J45909 Unspecified asthma, uncomplicated: Secondary | ICD-10-CM | POA: Diagnosis not present

## 2022-03-07 DIAGNOSIS — R7303 Prediabetes: Secondary | ICD-10-CM | POA: Diagnosis not present

## 2022-03-07 DIAGNOSIS — K219 Gastro-esophageal reflux disease without esophagitis: Secondary | ICD-10-CM | POA: Diagnosis not present

## 2022-03-07 DIAGNOSIS — R569 Unspecified convulsions: Secondary | ICD-10-CM | POA: Diagnosis not present

## 2022-03-07 DIAGNOSIS — E782 Mixed hyperlipidemia: Secondary | ICD-10-CM | POA: Diagnosis not present

## 2022-03-07 DIAGNOSIS — D508 Other iron deficiency anemias: Secondary | ICD-10-CM | POA: Diagnosis not present

## 2022-03-07 DIAGNOSIS — M25512 Pain in left shoulder: Secondary | ICD-10-CM | POA: Diagnosis not present

## 2022-03-10 ENCOUNTER — Telehealth (HOSPITAL_BASED_OUTPATIENT_CLINIC_OR_DEPARTMENT_OTHER): Payer: 59 | Admitting: Student in an Organized Health Care Education/Training Program

## 2022-03-10 ENCOUNTER — Encounter (HOSPITAL_COMMUNITY): Payer: Self-pay | Admitting: Student in an Organized Health Care Education/Training Program

## 2022-03-10 DIAGNOSIS — F4312 Post-traumatic stress disorder, chronic: Secondary | ICD-10-CM

## 2022-03-10 DIAGNOSIS — F250 Schizoaffective disorder, bipolar type: Secondary | ICD-10-CM

## 2022-03-10 MED ORDER — DIVALPROEX SODIUM ER 500 MG PO TB24: 500.0000 mg | ORAL_TABLET | Freq: Every day | ORAL | 1 refills | Status: DC

## 2022-03-10 MED ORDER — QUETIAPINE FUMARATE 400 MG PO TABS: 400.0000 mg | ORAL_TABLET | Freq: Every day | ORAL | 2 refills | Status: DC

## 2022-03-10 NOTE — Progress Notes (Signed)
[facility] MD/PA/NP OP Progress Note  03/10/2022 6:37 PM PADEE WADEL  MRN:  [id number]  Chief Complaint:  Chief Complaint  Patient presents with   Follow-up   Virtual Visit via Video Note  I connected with Kendra Roberts on 03/10/22 at  2:30 PM EST by a video enabled telemedicine application and verified that I am speaking with the correct person using two identifiers.  Location: Patient: Home Provider: Office   I discussed the limitations of evaluation and management by telemedicine and the availability of in person appointments. The patient expressed understanding and agreed to proceed.  History of Present Illness:   Kendra Roberts is a 47 yo patient w/ self- reported PPH of schizoaffective disorder, bipolar type, ADHD dx in childhood,  and PTSD and a PMH of TBI age 82 yo, hematuria w/ oncological concern current, sleep apnea?, and migraines. Patient reports she has been compliant with her Seroquel 442m QHS and lamictal 565mdaily.   Patient reports that her hallucinations have significantly decreased and she is no longer having any visual hallucinations and reports that her auditory hallucinations are now only "whispers/whistles."  Patient reports that instead she has been struggling more recently with decreased sleep as well as irritability.  Patient reports that for the last 2 weeks she has been having severe acid reflux and has been vomiting and not able to hold down water.  Patient reports she did go see her PCP who recommended she go to the ED; however, patient refused.  Patient reports that for the last 2 days she has been able to keep down her medication.  This provider reiterated to patient if she cannot keep down fluids she needs to go to the ED and can call her PCP regarding concerns about the amount of time she may have to spend in the ED.  Patient reports that prior to this event she was doing "okay" but endorses now her mood is "rough."  Patient reports that she is not sure  the Lamictal did very much for her endorse that she continued to have issues with agitation and irritability.  Patient reports she also continues to have crying spells.  Patient reports that her PCP is concerned about her thyroid and she does have an appointment coming up with the endocrinologist.  Patient reports that over the last few days despite not sleeping her mind does continue to race and she is feeling more agitated and impulsive.  Patient denies feeling as though she has a lot of energy.  Patient also reports that she has not been having any feelings of worthlessness or hopelessness.  Patient also denies SI and HI.    I discussed the assessment and treatment plan with the patient. The patient was provided an opportunity to ask questions and all were answered. The patient agreed with the plan and demonstrated an understanding of the instructions.   The patient was advised to call back or seek an in-person evaluation if the symptoms worsen or if the condition fails to improve as anticipated.  I provided 25 minutes of non-face-to-face time during this encounter.   JaFreida BusmanMD  Visit Diagnosis:    ICD-10-CM   1. Schizoaffective disorder, bipolar type (HC[place]ay F25.0 divalproex (DEPAKOTE ER) 500 MG 24 hr tablet    QUEtiapine (SEROQUEL) 400 MG tablet    2. Chronic post-traumatic stress disorder (PTSD)  F43.12 QUEtiapine (SEROQUEL) 400 MG tablet      Past Psychiatric History: Schizoaffective, bipolar type, PTSD, ADHD, Learning disabilities  INPT: Multiple- Dorthea Dix and Broughton OUTPT: Multiple Not currently on medications, but was last on Zoloft. Hx meds: Zoloft, Seroquel (sedating), Haldol (sedating), Zyprexa, Abilify, Prozac, Effexor, Celexa   Patient reports a hx of dyslexia "I read backwards" and teacher concern for her learning development when she was in middle school. Patient reports that teacher's concern led to her introduction to the mental health system.   Last  Visit: 09/2021: Started on Seroquel 275m QHS 11/2021: Patient was much more explosive and personality, and claimed to be a different personality than the person that the provider met at the initial visit.  Patient Seroquel was increased to 400 mg nightly.   01/2022-started patient on Lamictal and due to continued dysphoric mood.  Patient was also noted to endorse eating dirt as a form of coping, was witnessed doing this during the assessment.  This assessment was more eye-opening into patient's behaviors such as eating dirt or multiple personalities being immature coping mechanisms for patient due to her trauma history.  Patient was a bit more somber during this appointment due to pending eviction on day of appointment.  Continue Seroquel at 400 mg nightly.  Past Medical History:  Past Medical History:  Diagnosis Date   Arthritis    Asthma    Kidney stones    [place]    One seizure [place] ago    Past Surgical History:  Procedure Laterality Date   KIDNEY STONE SURGERY     TUBAL LIGATION      Family Psychiatric History:  Mother: Schizoaffective, bipolar type Brother: "same issues as me."  Family History: History reviewed. No pertinent family history.  Social History:  Social History   Socioeconomic History   Marital status: Single    Spouse name: Not on file   Number of children: Not on file   Years of education: Not on file   Highest education level: Not on file  Occupational History   Not on file  Tobacco Use   Smoking status: Some Days   Smokeless tobacco: Never  Substance and Sexual Activity   Alcohol use: No   Drug use: No   Sexual activity: Yes    Birth control/protection: Surgical  Other Topics Concern   Not on file  Social History Narrative   Not on file   Social Determinants of Health   Financial Resource Strain: Not on file  Food Insecurity: Not on file  Transportation Needs: Not on file  Physical Activity: Not on file  Stress: Not on file   Social Connections: Not on file    Allergies:  Allergies  Allergen Reactions   Latex Anaphylaxis and Hives   Penicillins Anaphylaxis    Metabolic Disorder Labs: No results found for: "HGBA1C", "MPG" Lab Results  Component Value Date   PROLACTIN 8.6 04/19/2009   No results found for: "CHOL", "TRIG", "HDL", "CHOLHDL", "VLDL", "LDLCALC" Lab Results  Component Value Date   TSH 1.705 04/19/2009   TSH 1.826 02/27/2009    Therapeutic Level Labs: No results found for: "LITHIUM" No results found for: "VALPROATE" No results found for: "CBMZ"  Current Medications: Current Outpatient Medications  Medication Sig Dispense Refill   divalproex (DEPAKOTE ER) 500 MG 24 hr tablet Take 1 tablet (500 mg total) by mouth at bedtime. 30 tablet 1   albuterol (PROVENTIL HFA;VENTOLIN HFA) 108 (90 BASE) MCG/ACT inhaler Inhale 2 puffs into the lungs every 6 (six) hours as needed for wheezing.     albuterol (PROVENTIL) (2.5 MG/3ML) 0.083% nebulizer solution Take  2.5 mg by nebulization every 6 (six) hours as needed for wheezing or shortness of breath.     AMLODIPINE BENZOATE PO Take 5 mg by mouth.     cholecalciferol (VITAMIN D3) 25 MCG (1000 UNIT) tablet Take 1,000 Units by mouth daily.     clindamycin (CLEOCIN) 300 MG capsule Take 1 capsule (300 mg total) by mouth 3 (three) times daily. 21 capsule 0   cyclobenzaprine (FLEXERIL) 10 MG tablet Take 1 tablet (10 mg total) by mouth 2 (two) times daily as needed for muscle spasms. 20 tablet 0   fluticasone (FLONASE) 50 MCG/ACT nasal spray Place 1 spray into both nostrils daily.     gabapentin (NEURONTIN) 400 MG capsule Take 400 mg by mouth 3 (three) times daily. Patient reports she is not taking currently, but has refills     montelukast (SINGULAIR) 10 MG tablet Take 10 mg by mouth daily.      Multiple Vitamin (MULTIVITAMIN WITH MINERALS) TABS tablet Take 1 tablet by mouth daily.     pantoprazole (PROTONIX) 40 MG tablet Take 40 mg by mouth daily.      QUEtiapine (SEROQUEL) 400 MG tablet Take 1 tablet (400 mg total) by mouth at bedtime. 30 tablet 2   rivaroxaban (XARELTO) 10 MG TABS tablet Take 1 tablet (10 mg total) by mouth daily. 45 tablet 0   traMADol (ULTRAM) 50 MG tablet Take 1 tablet (50 mg total) by mouth every 6 (six) hours as needed. 10 tablet 0   WIXELA INHUB 250-50 MCG/DOSE AEPB Inhale 1 puff into the lungs 2 (two) times daily.      No current facility-administered medications for this visit.     Musculoskeletal: Defer  Psychiatric Specialty Exam: Review of Systems  Psychiatric/Behavioral:  Positive for agitation, dysphoric mood, hallucinations and sleep disturbance. Negative for suicidal ideas.     There were no vitals taken for this visit.There is no height or weight on file to calculate BMI.  General Appearance: Casual cut her braids  Eye Contact:  Good  Speech:  Clear and Coherent  Volume:  Normal  Mood:  Anxious  Affect:  Appropriate  Thought Process:  Coherent not distracted today, focused well on assessment  Orientation:  Full (Time, Place, and Person)  Thought Content: Logical   Suicidal Thoughts:  No  Homicidal Thoughts:  No  Memory:  Immediate;   Good Recent;   Good  Judgement:  Poor  Insight:  Shallow  Psychomotor Activity:  NA  Concentration:  Concentration: Fair  Recall:  NA  Fund of Knowledge: Poor  Language: Fair  Akathisia:  No  Handed:    AIMS (if indicated): not done  Assets:  Communication Skills Desire for Improvement Housing Resilience  ADL's:  Intact  Cognition: WNL  Sleep:  Poor   Screenings: Flowsheet Row ED from 11/20/2021 in [facility] Emergency Department at [facility] ED from 08/25/2021 in [facility] Department at [facility] ED from 03/16/2021 in [facility] Emergency Department at [place] No Risk No Risk No Risk        Assessment and Plan: Kendra Roberts is a 47 yo patient w/ self- reported PPH of  schizoaffective disorder, bipolar type, ADHD dx in childhood,  and PTSD and a PMH of TBI age 34 yo, hematuria w/ oncological concern current, sleep apnea?, and migraines.  Patient continues to struggle with her mood fluctuation including significant irritability and agitation.  Patient does have a history of TBI  and is not doing well on Lamictal, we will change to Depakote for mood stabilization as well as irritability in the setting of a PMH of TBI.  Patient does appear to be doing well on Seroquel at her current dose as her hallucinations have significantly decreased.  Patient's current presentation and symptoms are likely secondary to not being able to keep her medicine down for the last 2 weeks.  Would also like to read patient's endocrinology results if her thyroid is not functioning appropriately this is likely also contributing to her mood instability.  Due to patient not being able to keep her medications down she may be in a hypomanic state as she is endorsing decreased sleep, increasing impulsivity and irritability however, she does not appear to be making any significantly dangerous impulsive decisions rather her decision to not go to the ED was thought out as a result of the long wait time she has had in the past.  Patient also appears to be fairly aware of her upcoming appointments and why she has them and what needs to be addressed to each appointment.  Chronic PTSD Schizoaffective bipolar type ( R/ O Bipolar disorder) Possible Multiple Personality Disorder (decreased concern 02/03/2022) vs General Personality disorder -Continue Seroquel to 449m QHS - Discontinue Lamictal 50 mg daily - Start Depakote ER 500 mg nightly Patient was educated on the side effects of Depakote (toxicity, weight gain, sedation etc.)  That if she does get an increase she will likely need monitoring, to which patient agreed she could obtain if requested.   Follow-up in approximately 6 weeks   Collaboration of Care:  Collaboration of Care:   Patient/Guardian was advised Release of Information must be obtained prior to any record release in order to collaborate their care with an outside provider. Patient/Guardian was advised if they have not already done so to contact the registration department to sign all necessary forms in order for [place] release information regarding their care.   Consent: Patient/Guardian gives verbal consent for treatment and assignment of benefits for services provided during this visit. Patient/Guardian expressed understanding and agreed to proceed.   PGY-3 JFreida Busman MD 03/10/2022, 6:37 PM

## 2022-03-11 NOTE — Addendum Note (Signed)
Addended by: Charlette Caffey on: 03/11/2022 09:40 AM   Modules accepted: Level of Service

## 2022-04-10 ENCOUNTER — Telehealth (HOSPITAL_COMMUNITY): Payer: 59 | Admitting: Student in an Organized Health Care Education/Training Program

## 2022-04-16 ENCOUNTER — Telehealth (HOSPITAL_COMMUNITY): Payer: 59 | Admitting: Student in an Organized Health Care Education/Training Program

## 2022-04-16 NOTE — Progress Notes (Deleted)
[facility] MD/PA/NP OP Progress Note  04/16/2022 10:18 AM Kendra Roberts  MRN:  [id number]  Chief Complaint: No chief complaint on file.  Virtual Visit via Video Note  I connected with Kendra Roberts on 04/16/22 at  1:30 PM EDT by a video enabled telemedicine application and verified that I am speaking with the correct person using two identifiers.  Location: Patient: *** Provider: Office   I discussed the limitations of evaluation and management by telemedicine and the availability of in person appointments. The patient expressed understanding and agreed to proceed.  History of Present Illness: Kendra Roberts is a 47 yo patient w/ self- reported PPH of schizoaffective disorder, bipolar type, ADHD dx in childhood,  and PTSD and a PMH of TBI age 25 yo, hematuria w/ oncological concern current, sleep apnea?, and migraines.  Current medication regimen:  Seroquel 400 mg nightly Depakote ER 500 mg nightly   I discussed the assessment and treatment plan with the patient. The patient was provided an opportunity to ask questions and all were answered. The patient agreed with the plan and demonstrated an understanding of the instructions.   The patient was advised to call back or seek an in-person evaluation if the symptoms worsen or if the condition fails to improve as anticipated.  I provided *** minutes of non-face-to-face time during this encounter.   Kendra Busman, MD  Visit Diagnosis: No diagnosis found.  Past Psychiatric History: Schizoaffective, bipolar type, PTSD, ADHD, Learning disabilities   INPT: Multiple- Dorthea Dix and Broughton OUTPT: Multiple Not currently on medications, but was last on Zoloft. Hx meds: Zoloft, Seroquel (sedating), Haldol (sedating), Zyprexa, Abilify, Prozac, Effexor, Celexa   Patient reports a hx of dyslexia "I read backwards" and teacher concern for her learning development when she was in middle school. Patient reports that teacher's concern led to  her introduction to the mental health system.   Last Visit: 09/2021: Started on Seroquel 200mg  QHS 11/2021: Patient was much more explosive and personality, and claimed to be a different personality than the person that the provider met at the initial visit.  Patient Seroquel was increased to 400 mg nightly.   01/2022-started patient on Lamictal and due to continued dysphoric mood.  Patient was also noted to endorse eating dirt as a form of coping, was witnessed doing this during the assessment.  This assessment was more eye-opening into patient's behaviors such as eating dirt or multiple personalities being immature coping mechanisms for patient due to her trauma history.  Patient was a bit more somber during this appointment due to pending eviction on day of appointment.  Continue Seroquel at 400 mg nightly.  02/2022: Patient no longer having [facility] endorses the AH has significantly decreased.  Patient was having a lot of ED visits due to emesis prior to this appointment.  Patient was having crying spells.  Patient was also having a follow-up with endocrinologist related to thyroid patient's Lamictal was discontinued and Depakote was started to help with mood stability.  Past Medical History:  Past Medical History:  Diagnosis Date   Arthritis    Asthma    Kidney stones    Seizures ([place])    One seizure years ago    Past Surgical History:  Procedure Laterality Date   KIDNEY STONE SURGERY     TUBAL LIGATION      Family Psychiatric History:  Mother: Schizoaffective, bipolar type Brother: "same issues as me."  Family History: No family history on file.  Social History:  Social History   Socioeconomic History   Marital status: Single    Spouse name: Not on file   Number of children: Not on file   Years of education: Not on file   Highest education level: Not on file  Occupational History   Not on file  Tobacco Use   Smoking status: Some Days   Smokeless tobacco: Never  Substance and  Sexual Activity   Alcohol use: No   Drug use: No   Sexual activity: Yes    Birth control/protection: Surgical  Other Topics Concern   Not on file  Social History Narrative   Not on file   Social Determinants of Health   Financial Resource Strain: Not on file  Food Insecurity: Not on file  Transportation Needs: Not on file  Physical Activity: Not on file  Stress: Not on file  Social Connections: Not on file    Allergies:  Allergies  Allergen Reactions   Latex Anaphylaxis and Hives   Penicillins Anaphylaxis    Metabolic Disorder Labs: No results found for: "HGBA1C", "MPG" Lab Results  Component Value Date   PROLACTIN 8.6 04/19/2009   No results found for: "CHOL", "TRIG", "HDL", "CHOLHDL", "VLDL", "LDLCALC" Lab Results  Component Value Date   TSH 1.705 04/19/2009   TSH 1.826 02/27/2009    Therapeutic Level Labs: No results found for: "LITHIUM" No results found for: "VALPROATE" No results found for: "CBMZ"  Current Medications: Current Outpatient Medications  Medication Sig Dispense Refill   albuterol (PROVENTIL HFA;VENTOLIN HFA) 108 (90 BASE) MCG/ACT inhaler Inhale 2 puffs into the lungs every 6 (six) hours as needed for wheezing.     albuterol (PROVENTIL) (2.5 MG/3ML) 0.083% nebulizer solution Take 2.5 mg by nebulization every 6 (six) hours as needed for wheezing or shortness of breath.     AMLODIPINE BENZOATE PO Take 5 mg by mouth.     cholecalciferol (VITAMIN D3) 25 MCG (1000 UNIT) tablet Take 1,000 Units by mouth daily.     clindamycin (CLEOCIN) 300 MG capsule Take 1 capsule (300 mg total) by mouth 3 (three) times daily. 21 capsule 0   cyclobenzaprine (FLEXERIL) 10 MG tablet Take 1 tablet (10 mg total) by mouth 2 (two) times daily as needed for muscle spasms. 20 tablet 0   divalproex (DEPAKOTE ER) 500 MG 24 hr tablet Take 1 tablet (500 mg total) by mouth at bedtime. 30 tablet 1   fluticasone (FLONASE) 50 MCG/ACT nasal spray Place 1 spray into both nostrils  daily.     gabapentin (NEURONTIN) 400 MG capsule Take 400 mg by mouth 3 (three) times daily. Patient reports she is not taking currently, but has refills     montelukast (SINGULAIR) 10 MG tablet Take 10 mg by mouth daily.      Multiple Vitamin (MULTIVITAMIN WITH MINERALS) TABS tablet Take 1 tablet by mouth daily.     pantoprazole (PROTONIX) 40 MG tablet Take 40 mg by mouth daily.     QUEtiapine (SEROQUEL) 400 MG tablet Take 1 tablet (400 mg total) by mouth at bedtime. 30 tablet 2   rivaroxaban (XARELTO) 10 MG TABS tablet Take 1 tablet (10 mg total) by mouth daily. 45 tablet 0   traMADol (ULTRAM) 50 MG tablet Take 1 tablet (50 mg total) by mouth every 6 (six) hours as needed. 10 tablet 0   WIXELA INHUB 250-50 MCG/DOSE AEPB Inhale 1 puff into the lungs 2 (two) times daily.      No current facility-administered medications for this visit.  Musculoskeletal: Strength & Muscle Tone: {desc; muscle tone:32375} Gait & Station: {PE GAIT ED EF:[id number] Patient leans: {Patient Leans:21022755}  Psychiatric Specialty Exam: Review of Systems  There were no vitals taken for this visit.There is no height or weight on file to calculate BMI.  General Appearance: {Appearance:22683}  Eye Contact:  {BHH EYE CONTACT:22684}  Speech:  {Speech:22685}  Volume:  {Volume (PAA):22686}  Mood:  {BHH MOOD:22306}  Affect:  {Affect (PAA):22687}  Thought Process:  {Thought Process (PAA):22688}  Orientation:  {BHH ORIENTATION (PAA):22689}  Thought Content: {Thought Content:22690}   Suicidal Thoughts:  {ST/HT (PAA):22692}  Homicidal Thoughts:  {ST/HT (PAA):22692}  Memory:  {BHH MEMORY:22881}  Judgement:  {Judgement (PAA):22694}  Insight:  {Insight (PAA):22695}  Psychomotor Activity:  {Psychomotor (PAA):22696}  Concentration:  {Concentration:21399}  Recall:  {BHH GOOD/FAIR/POOR:22877}  Fund of Knowledge: {BHH GOOD/FAIR/POOR:22877}  Language: {BHH GOOD/FAIR/POOR:22877}  Akathisia:  {BHH YES OR NO:22294}   Handed:  {Handed:22697}  AIMS (if indicated): {Desc; done/not:10129}  Assets:  {Assets (PAA):22698}  ADL's:  {BHH XO:4411959  Cognition: {chl bhh cognition:304700322}  Sleep:  {BHH GOOD/FAIR/POOR:22877}   Screenings: Flowsheet Row ED from 11/20/2021 in [facility] Emergency Department at [facility] ED from 08/25/2021 in [facility] Department at [facility] ED from 03/16/2021 in [facility] Emergency Department at [place] No Risk No Risk No Risk        Assessment and Plan:   Chronic PTSD Schizoaffective bipolar type ( R/ O Bipolar disorder) Possible Multiple Personality Disorder (decreased concern 02/03/2022) vs General Personality disorder  Collaboration of Care: Collaboration of Care:   Patient/Guardian was advised Release of Information must be obtained prior to any record release in order to collaborate their care with an outside provider. Patient/Guardian was advised if they have not already done so to contact the registration department to sign all necessary forms in order for [place] to release information regarding their care.   Consent: Patient/Guardian gives verbal consent for treatment and assignment of benefits for services provided during this visit. Patient/Guardian expressed understanding and agreed to proceed.    Kendra Busman, MD 04/16/2022, 10:18 AM

## 2022-05-20 ENCOUNTER — Telehealth (HOSPITAL_COMMUNITY): Payer: Self-pay | Admitting: *Deleted

## 2022-05-20 NOTE — Telephone Encounter (Signed)
QUEtiapine (SEROQUEL) 400 MG tablet   Take 1 tablet (400 mg total) by mouth at bedtime. Dispense: 30 tablet, Refills: 2 ordered  03/10/2022 03/10/2023 Bobbye Morton, MD           Summary: Take 1 tablet (400 mg total) by  mouth at bedtime., Starting Mon 03/10/2022,  Until Tue 03/10/2023, Normal Dose, Route, Frequency: 400 mg,  Oral, Daily at bedtime Start Date & Time: 03/10/2022 End w/ Doses: 03/10/2023 after 365 doses Ord/Sold: 03/10/2022  (O)Ordered On: 03/10/2022 ReportPharmacy: [id number] - [place], [place] -  [address] AT [address] OF  [address]

## 2022-05-20 NOTE — Telephone Encounter (Signed)
Denied until patient has a f/u appt scheduled at the practice. She missed her last appt.

## 2022-05-22 ENCOUNTER — Telehealth (HOSPITAL_COMMUNITY): Payer: Self-pay | Admitting: *Deleted

## 2022-05-22 NOTE — Telephone Encounter (Signed)
She needs a f/u before I refill anything.

## 2022-05-22 NOTE — Telephone Encounter (Signed)
REFILL REQUEST: QUEtiapine (SEROQUEL) 400 MG tablet  LAST APPT: 04/16/22 NEXT APPT: 0 SCHEDULED  QUEtiapine (SEROQUEL) 400 MG tablet  03/10/2022 (O) 03/10/2022          Dose, Frequency: 400 mg, Daily at bedtimeDose, Route, Frequency: 400 mg, Oral, Daily at bedtimeDispense: 30 tabletRefills:  2 ordered Pharmacy: [id number] - [place],  - [address] OF [address]  Summary: Take 1 tablet (400 mg total) by mouth at bedtime.,  Starting Mon 03/10/2022, Until Tue 03/10/2023,  Normal

## 2022-05-28 ENCOUNTER — Telehealth (HOSPITAL_COMMUNITY): Payer: Self-pay | Admitting: *Deleted

## 2022-05-28 NOTE — Telephone Encounter (Signed)
QUEtiapine (SEROQUEL) 400 MG tablet Take 1 tablet (400 mg total) by mouth at bedtime. Dispense: 30 tablet, Refills: 2 ordered  03/10/2022 03/10/2023 Bobbye Morton, MD          Summary: Take 1 tablet (400 mg total) by mouth at bedtime.,  Starting Mon 03/10/2022, Until Tue 03/10/2023,  Normal Dose, Route, Frequency: 400 mg, Oral, Daily  at bedtimeDx Associated:  Start Date & Time: 03/10/2022 End w/ Doses: 03/10/2023 after 365 doses Ord/Sold: 03/10/2022 (O) Ordered On: 03/10/2022 ReportPharmacy: [id number]  [place], [place] - [address] AT  [facility] OF [address]     LAST FILL: 05/09/22

## 2022-05-28 NOTE — Telephone Encounter (Signed)
Again, she needs to schedule a f/u appt before I sent in the refill

## 2022-05-29 ENCOUNTER — Telehealth (HOSPITAL_COMMUNITY): Payer: 59 | Admitting: Student in an Organized Health Care Education/Training Program

## 2022-05-29 ENCOUNTER — Encounter (HOSPITAL_COMMUNITY): Payer: Self-pay | Admitting: Student in an Organized Health Care Education/Training Program

## 2022-05-29 DIAGNOSIS — F250 Schizoaffective disorder, bipolar type: Secondary | ICD-10-CM

## 2022-05-29 DIAGNOSIS — F4312 Post-traumatic stress disorder, chronic: Secondary | ICD-10-CM

## 2022-05-29 MED ORDER — DIVALPROEX SODIUM ER 500 MG PO TB24: 500.0000 mg | ORAL_TABLET | Freq: Every day | ORAL | 2 refills | Status: DC

## 2022-05-29 MED ORDER — QUETIAPINE FUMARATE 400 MG PO TABS: 400.0000 mg | ORAL_TABLET | Freq: Every day | ORAL | 2 refills | Status: DC

## 2022-05-29 NOTE — Progress Notes (Signed)
Virtual Visit via Video Note  I connected with Kendra Roberts on 05/29/22 at  1:30 PM EDT by a video enabled telemedicine application and verified that I am speaking with the correct person using two identifiers.  Location: Patient: Client's home in [place] Provider: Office   I discussed the limitations of evaluation and management by telemedicine and the availability of in person appointments. The patient expressed understanding and agreed to proceed.     I discussed the assessment and treatment plan with the patient. The patient was provided an opportunity to ask questions and all were answered. The patient agreed with the plan and demonstrated an understanding of the instructions.   The patient was advised to call back or seek an in-person evaluation if the symptoms worsen or if the condition fails to improve as anticipated.  I provided 30 minutes of non-face-to-face time during this encounter.   Bobbye Morton, MD  [facility] MD/PA/NP OP Progress Note  05/29/2022 2:19 PM MARYE EAGEN  MRN:  [id number]  Chief Complaint:  Chief Complaint  Patient presents with   Follow-up   HPI: Kera Deacon is a 47 yo patient w/ self- reported PPH of schizoaffective disorder, bipolar type, ADHD dx in childhood,  and PTSD and a PMH of TBI age 45 yo, hematuria w/ oncological concern current, sleep apnea?, and migraines. Patient current regimen: Seroquel 400mg  QHS and Depakote ER 500mg  QHS (she ran out).    Patient reports that she did not feel sedated on Depakote. She reports that her client/friends think her mood has been stable on this combination. Patient reports that she sleeps, but does not feel well rested. She wonders about if she needs a sleep study. Patient reports that she feels like she has gained a lot of weight. Patient endorses that she does feel like her energy is pretty high, she has decided to go back to school, and is doing a lot of chores around the home. She feels like she can't stop  moving. Patient reports that this high level of energy has been this way for 1 week. This coincides with with when she ran out of Depakote.  Patient notes she has not been irritable since she has her new job as a PCA. Patient reports that she really feels working helps her mood. She feels like her overall mood is better.   Patient denies SI and HI. She reports that she see's ghosts at anytime of day and they are usually comforting. She believes she can see deceased spirits of those who died in places.   Objectively, patient does not appear to have a large weight fluctuation and her chart indicates predDM, nor DM.   Smoking: smokes in front of camera   Visit Diagnosis:    ICD-10-CM   1. Schizoaffective disorder, bipolar type (HCC)  F25.0     2. Chronic post-traumatic stress disorder (PTSD)  F43.12       Past Psychiatric History: Schizoaffective, bipolar type, PTSD, ADHD, Learning disabilities   INPT: Multiple- [facility] OUTPT: Multiple Not currently on medications, but was last on Zoloft. Hx meds: Zoloft, Seroquel (sedating), Haldol (sedating), Zyprexa, Abilify, Prozac, Effexor, Celexa   Patient reports a hx of dyslexia "I read backwards" and teacher concern for her learning development when she was in middle school. Patient reports that teacher's concern led to her introduction to the mental health system.   Last Visit: 09/2021: Started on Seroquel 200mg  QHS 11/2021: Patient was much more explosive and personality, and  claimed to be a different personality than the person that the provider met at the initial visit.  Patient Seroquel was increased to 400 mg nightly.   01/2022-started patient on Lamictal and due to continued dysphoric mood.  Patient was also noted to endorse eating dirt as a form of coping, was witnessed doing this during the assessment.  This assessment was more eye-opening into patient's behaviors such as eating dirt or multiple personalities being  immature coping mechanisms for patient due to her trauma history.  Patient was a bit more somber during this appointment due to pending eviction on day of appointment.  Continue Seroquel at 400 mg nightly.  02/2022- Still having irritability and agitation. Stopped lamictal and switched to depakote 50mg  qhs, CONTINUED SEROQUEL hallucinations decreased significantly, having some emesis independent of psych and patient was endorsing some decreased sleep and mood changes, concerned for hypomania due to inability to keep medicines down.   Past Medical History:  Past Medical History:  Diagnosis Date   Arthritis    Asthma    Kidney stones    Seizures (HCC)    One seizure years ago    Past Surgical History:  Procedure Laterality Date   KIDNEY STONE SURGERY     TUBAL LIGATION      Family Psychiatric History: Mother: Schizoaffective, bipolar type Brother: "same issues as me."  Family History: History reviewed. No pertinent family history.  Social History:  Social History   Socioeconomic History   Marital status: Single    Spouse name: Not on file   Number of children: Not on file   Years of education: Not on file   Highest education level: Not on file  Occupational History   Not on file  Tobacco Use   Smoking status: Some Days   Smokeless tobacco: Never  Substance and Sexual Activity   Alcohol use: No   Drug use: No   Sexual activity: Yes    Birth control/protection: Surgical  Other Topics Concern   Not on file  Social History Narrative   Not on file   Social Determinants of Health   Financial Resource Strain: Not on file  Food Insecurity: Not on file  Transportation Needs: Not on file  Physical Activity: Not on file  Stress: Not on file  Social Connections: Not on file    Allergies:  Allergies  Allergen Reactions   Latex Anaphylaxis and Hives   Penicillins Anaphylaxis    Metabolic Disorder Labs: No results found for: "HGBA1C", "MPG" Lab Results  Component  Value Date   PROLACTIN 8.6 04/19/2009   No results found for: "CHOL", "TRIG", "HDL", "CHOLHDL", "VLDL", "LDLCALC" Lab Results  Component Value Date   TSH 1.705 04/19/2009   TSH 1.826 02/27/2009    Therapeutic Level Labs: No results found for: "LITHIUM" No results found for: "VALPROATE" No results found for: "CBMZ"  Current Medications: Current Outpatient Medications  Medication Sig Dispense Refill   albuterol (PROVENTIL HFA;VENTOLIN HFA) 108 (90 BASE) MCG/ACT inhaler Inhale 2 puffs into the lungs every 6 (six) hours as needed for wheezing.     albuterol (PROVENTIL) (2.5 MG/3ML) 0.083% nebulizer solution Take 2.5 mg by nebulization every 6 (six) hours as needed for wheezing or shortness of breath.     AMLODIPINE BENZOATE PO Take 5 mg by mouth.     cholecalciferol (VITAMIN D3) 25 MCG (1000 UNIT) tablet Take 1,000 Units by mouth daily.     clindamycin (CLEOCIN) 300 MG capsule Take 1 capsule (300 mg total) by mouth  3 (three) times daily. 21 capsule 0   cyclobenzaprine (FLEXERIL) 10 MG tablet Take 1 tablet (10 mg total) by mouth 2 (two) times daily as needed for muscle spasms. 20 tablet 0   divalproex (DEPAKOTE ER) 500 MG 24 hr tablet Take 1 tablet (500 mg total) by mouth at bedtime. 30 tablet 1   fluticasone (FLONASE) 50 MCG/ACT nasal spray Place 1 spray into both nostrils daily.     gabapentin (NEURONTIN) 400 MG capsule Take 400 mg by mouth 3 (three) times daily. Patient reports she is not taking currently, but has refills     montelukast (SINGULAIR) 10 MG tablet Take 10 mg by mouth daily.      Multiple Vitamin (MULTIVITAMIN WITH MINERALS) TABS tablet Take 1 tablet by mouth daily.     pantoprazole (PROTONIX) 40 MG tablet Take 40 mg by mouth daily.     QUEtiapine (SEROQUEL) 400 MG tablet Take 1 tablet (400 mg total) by mouth at bedtime. 30 tablet 2   rivaroxaban (XARELTO) 10 MG TABS tablet Take 1 tablet (10 mg total) by mouth daily. 45 tablet 0   traMADol (ULTRAM) 50 MG tablet Take 1  tablet (50 mg total) by mouth every 6 (six) hours as needed. 10 tablet 0   WIXELA INHUB 250-50 MCG/DOSE AEPB Inhale 1 puff into the lungs 2 (two) times daily.      No current facility-administered medications for this visit.    Psychiatric Specialty Exam: Review of Systems  Psychiatric/Behavioral:  Positive for hallucinations and sleep disturbance. Negative for dysphoric mood and suicidal ideas. The patient is not nervous/anxious.     There were no vitals taken for this visit.There is no height or weight on file to calculate BMI.  General Appearance: Casual has 2 new VERY obvious nose rings  Eye Contact:  Good  Speech:  Clear and Coherent  Volume:  Normal  Mood:  Euthymic  Affect:  Appropriate and Congruent  Thought Process:  Coherent  Orientation:  Full (Time, Place, and Person)  Thought Content: Ideas of Reference:   Delusions (more comforting and align with "magical thinking")  Suicidal Thoughts:  No  Homicidal Thoughts:  No  Memory:  Immediate;   Good Recent;   Good  Judgement:  Other:  Improved  Insight:  Lacking  Psychomotor Activity:  NA  Concentration:  Concentration: Fair  Recall:  NA  Fund of Knowledge: Fair  Language: Fair  Akathisia:  No  Handed:    AIMS (if indicated): not done  Assets:  Communication Skills Desire for Improvement Housing Resilience Social Support  ADL's:  Intact  Cognition: WNL  Sleep:  Fair   Screenings: Flowsheet Row ED from 11/20/2021 in [facility] Department at [facility] ED from 08/25/2021 in [facility] Emergency Department at [facility] ED from 03/16/2021 in [facility] Emergency Department at [facility]  C-SSRS RISK CATEGORY No Risk No Risk No Risk        Assessment and Plan:  Patient noted her thyroid is swollen this may be a significant contribution to the weight issues patient is concerned about. At this time depakote and Seroquel appear to provide more benefit than harm despite risk for  weight gain. Otherwise patient appeared to be more upbeat and less irritable, as she noted and her friends have told her. Depakote appears to be helpful as her energy and racing thoughts have increased since she ran out. Will continue regimen. Ave communicated importance of patient f/u with her other  providers and she will be seeing her PCP tom, patient wrote down requested labs including Depakote lvl, and will request that they order them and she can get all of her labs for psych and PCP around Tuesday. While patient has also of immature coping mechanisms (different personas/ eating dirt/ not following up- running away/ talking to "ghosts"), work appears to a mature coping mechanism for patient.   Chronic PTSD Schizoaffective bipolar type ( R/ O Bipolar disorder) General Personality disorder -Continue Seroquel to 400mg  QHS - ReStart Depakote ER 500 mg nightly Will get labs (Depakote lvl, CMP, and TSH) with her PCP, they will fax.   Collaboration of Care: Collaboration of Care:   Patient/Guardian was advised Release of Information must be obtained prior to any record release in order to collaborate their care with an outside provider. Patient/Guardian was advised if they have not already done so to contact the registration department to sign all necessary forms in order for [place] to release information regarding their care.   Consent: Patient/Guardian gives verbal consent for treatment and assignment of benefits for services provided during this visit. Patient/Guardian expressed understanding and agreed to proceed.   PGY-3 Bobbye Morton, MD 05/29/2022, 2:19 PM

## 2022-06-02 DIAGNOSIS — Z0000 Encounter for general adult medical examination without abnormal findings: Secondary | ICD-10-CM | POA: Diagnosis not present

## 2022-06-02 DIAGNOSIS — J45909 Unspecified asthma, uncomplicated: Secondary | ICD-10-CM | POA: Diagnosis not present

## 2022-06-02 DIAGNOSIS — I10 Essential (primary) hypertension: Secondary | ICD-10-CM | POA: Diagnosis not present

## 2022-06-02 DIAGNOSIS — R569 Unspecified convulsions: Secondary | ICD-10-CM | POA: Diagnosis not present

## 2022-06-02 DIAGNOSIS — D508 Other iron deficiency anemias: Secondary | ICD-10-CM | POA: Diagnosis not present

## 2022-06-02 DIAGNOSIS — K219 Gastro-esophageal reflux disease without esophagitis: Secondary | ICD-10-CM | POA: Diagnosis not present

## 2022-06-02 DIAGNOSIS — R7303 Prediabetes: Secondary | ICD-10-CM | POA: Diagnosis not present

## 2022-06-02 DIAGNOSIS — E782 Mixed hyperlipidemia: Secondary | ICD-10-CM | POA: Diagnosis not present

## 2022-06-02 DIAGNOSIS — M25511 Pain in right shoulder: Secondary | ICD-10-CM | POA: Diagnosis not present

## 2022-07-03 ENCOUNTER — Telehealth (HOSPITAL_COMMUNITY): Payer: 59 | Admitting: Student in an Organized Health Care Education/Training Program

## 2022-07-03 DIAGNOSIS — F4312 Post-traumatic stress disorder, chronic: Secondary | ICD-10-CM

## 2022-07-03 DIAGNOSIS — F250 Schizoaffective disorder, bipolar type: Secondary | ICD-10-CM

## 2022-07-03 MED ORDER — DIVALPROEX SODIUM ER 500 MG PO TB24: 500.0000 mg | ORAL_TABLET | Freq: Every day | ORAL | 2 refills | Status: DC

## 2022-07-03 MED ORDER — QUETIAPINE FUMARATE 400 MG PO TABS: 400.0000 mg | ORAL_TABLET | Freq: Every day | ORAL | 2 refills | Status: DC

## 2022-07-03 NOTE — Progress Notes (Signed)
Virtual Visit via Video Note  I connected with Kendra Roberts on 07/03/22 at  1:00 PM EDT by a video enabled telemedicine application and verified that I am speaking with the correct person using two identifiers.  Location: Patient: Set designer, parked Provider: Office   I discussed the limitations of evaluation and management by telemedicine and the availability of in person appointments. The patient expressed understanding and agreed to proceed.     I discussed the assessment and treatment plan with the patient. The patient was provided an opportunity to ask questions and all were answered. The patient agreed with the plan and demonstrated an understanding of the instructions.   The patient was advised to call back or seek an in-person evaluation if the symptoms worsen or if the condition fails to improve as anticipated.  I provided 25 minutes of non-face-to-face time during this encounter.   Kendra Morton, MD  [facility] MD/PA/NP OP Progress Note  07/03/2022 5:43 PM Kendra Roberts  MRN:  [id number]  Chief Complaint:  Chief Complaint  Patient presents with   Follow-up   HPI: Kendra Roberts is a 47 yo patient w/ self- reported PPH of schizoaffective disorder, bipolar type, ADHD dx in childhood,  and PTSD and a PMH of TBI age 47 yo, hematuria w/ oncological concern current, sleep apnea?, and migraines.   Patient current regimen: Seroquel 400mg  QHS and Depakote ER 500mg  QHS.    Dr. Morrie Sheldon with [facility] has done her labs, will reach out.  Patient reports that she is compliant with her medications. Patient reports that she is interested in getting on a contraceptive therapy since she is on Depakote.   Patient denies SI but she feels like she is dwelling on negative things. Patient reports that her sleep is getting a bit disrupted by dreams of the past. Patient does not feel like she is resting well. Patient reports that she feels like she is dissociating.  Patient continues to use THC  and she does not plan to stop as she started using to get of her severe depression when her 47rd daughter died. Patient denies HI. Patient reports she is doing well at work and this takes up most of her time.   Patient reports that today she is "Kendra Roberts" and when provider mentions that last time patient was not as irritable at last visit, she was probably "Kendra Roberts or Kendra Roberts." She responds to the name Kendra Roberts throughout the assessment.   Patient endorses she see's and can occasionally communicate with "angels" and says that she really thinks it is her deceased daughter. Patient reports that she does feel stable on her current regimen. She does endorses that her acid reflux if flaring up and she is concerned that she may start throwing up more frequently her medication wont be in her system. Unfortunately she tries to eat and her stomach is causing her to have emesis.   Etoh- does not use   LAB DRAWS  hysterectomy  Patient reports that she is compliant with her medications.   Visit Diagnosis:    ICD-10-CM   1. Schizoaffective disorder, bipolar type (HCC)  F25.0 QUEtiapine (SEROQUEL) 400 MG tablet    divalproex (DEPAKOTE ER) 500 MG 24 hr tablet    2. Chronic post-traumatic stress disorder (PTSD)  F43.12 QUEtiapine (SEROQUEL) 400 MG tablet      Past Psychiatric History: Schizoaffective, bipolar type, PTSD, ADHD, Learning disabilities   INPT: Multiple- [facility] OUTPT: Multiple Not currently on medications, but was last  on Zoloft. Hx meds: Zoloft, Seroquel (sedating), Haldol (sedating), Zyprexa, Abilify, Prozac, Effexor, Celexa   Patient reports a hx of dyslexia "I read backwards" and teacher concern for her learning development when she was in middle school. Patient reports that teacher's concern led to her introduction to the mental health system.   Last Visit: 09/2021: Started on Seroquel 200mg  QHS 11/2021: Patient was much more explosive and personality, and claimed to  be a different personality than the person that the provider met at the initial visit.  Patient Seroquel was increased to 400 mg nightly.   01/2022-started patient on Lamictal and due to continued dysphoric mood.  Patient was also noted to endorse eating dirt as a form of coping, was witnessed doing this during the assessment.  This assessment was more eye-opening into patient's behaviors such as eating dirt or multiple personalities being immature coping mechanisms for patient due to her trauma history.  Patient was a bit more somber during this appointment due to pending eviction on day of appointment.  Continue Seroquel at 400 mg nightly.   02/2022- Still having irritability and agitation. Stopped lamictal and switched to depakote 50mg  qhs, CONTINUED SEROQUEL hallucinations decreased significantly, having some emesis independent of psych and patient was endorsing some decreased sleep and mood changes, concerned for hypomania due to inability to keep medicines down.   05/2022-At this time depakote and Seroquel appear to provide more benefit than harm despite risk for weight gain. Otherwise patient appeared to be more upbeat and less irritable, as she noted and her friends have told her. Continued depakote er 500mg  QHS and seroquel 400mg  Qhs  Past Medical History:  Past Medical History:  Diagnosis Date   Arthritis    Asthma    Kidney stones    Seizures (HCC)    One seizure years ago    Past Surgical History:  Procedure Laterality Date   KIDNEY STONE SURGERY     TUBAL LIGATION      Family Psychiatric History: Mother: Schizoaffective, bipolar type Brother: "same issues as me."  Family History: No family history on file.  Social History:  Social History   Socioeconomic History   Marital status: Single    Spouse name: Not on file   Number of children: Not on file   Years of education: Not on file   Highest education level: Not on file  Occupational History   Not on file  Tobacco Use    Smoking status: Some Days   Smokeless tobacco: Never  Substance and Sexual Activity   Alcohol use: No   Drug use: No   Sexual activity: Yes    Birth control/protection: Surgical  Other Topics Concern   Not on file  Social History Narrative   Not on file   Social Determinants of Health   Financial Resource Strain: Not on file  Food Insecurity: Not on file  Transportation Needs: Not on file  Physical Activity: Not on file  Stress: Not on file  Social Connections: Not on file    Allergies:  Allergies  Allergen Reactions   Latex Anaphylaxis and Hives   Penicillins Anaphylaxis    Metabolic Disorder Labs: No results found for: "HGBA1C", "MPG" Lab Results  Component Value Date   PROLACTIN 8.6 04/19/2009   No results found for: "CHOL", "TRIG", "HDL", "CHOLHDL", "VLDL", "LDLCALC" Lab Results  Component Value Date   TSH 1.705 04/19/2009   TSH 1.826 02/27/2009    Therapeutic Level Labs: No results found for: "LITHIUM" No results found  for: "VALPROATE" No results found for: "CBMZ"  Current Medications: Current Outpatient Medications  Medication Sig Dispense Refill   albuterol (PROVENTIL HFA;VENTOLIN HFA) 108 (90 BASE) MCG/ACT inhaler Inhale 2 puffs into the lungs every 6 (six) hours as needed for wheezing.     albuterol (PROVENTIL) (2.5 MG/3ML) 0.083% nebulizer solution Take 2.5 mg by nebulization every 6 (six) hours as needed for wheezing or shortness of breath.     AMLODIPINE BENZOATE PO Take 5 mg by mouth.     cholecalciferol (VITAMIN D3) 25 MCG (1000 UNIT) tablet Take 1,000 Units by mouth daily.     clindamycin (CLEOCIN) 300 MG capsule Take 1 capsule (300 mg total) by mouth 3 (three) times daily. 21 capsule 0   cyclobenzaprine (FLEXERIL) 10 MG tablet Take 1 tablet (10 mg total) by mouth 2 (two) times daily as needed for muscle spasms. 20 tablet 0   divalproex (DEPAKOTE ER) 500 MG 24 hr tablet Take 1 tablet (500 mg total) by mouth at bedtime. 30 tablet 2    fluticasone (FLONASE) 50 MCG/ACT nasal spray Place 1 spray into both nostrils daily.     gabapentin (NEURONTIN) 400 MG capsule Take 400 mg by mouth 3 (three) times daily. Patient reports she is not taking currently, but has refills     montelukast (SINGULAIR) 10 MG tablet Take 10 mg by mouth daily.      Multiple Vitamin (MULTIVITAMIN WITH MINERALS) TABS tablet Take 1 tablet by mouth daily.     pantoprazole (PROTONIX) 40 MG tablet Take 40 mg by mouth daily.     QUEtiapine (SEROQUEL) 400 MG tablet Take 1 tablet (400 mg total) by mouth at bedtime. 30 tablet 2   rivaroxaban (XARELTO) 10 MG TABS tablet Take 1 tablet (10 mg total) by mouth daily. 45 tablet 0   traMADol (ULTRAM) 50 MG tablet Take 1 tablet (50 mg total) by mouth every 6 (six) hours as needed. 10 tablet 0   WIXELA INHUB 250-50 MCG/DOSE AEPB Inhale 1 puff into the lungs 2 (two) times daily.      No current facility-administered medications for this visit.     Psychiatric Specialty Exam: Review of Systems  Psychiatric/Behavioral:  Positive for hallucinations. Negative for dysphoric mood, sleep disturbance and suicidal ideas.     There were no vitals taken for this visit.There is no height or weight on file to calculate BMI.  General Appearance: Casual  Eye Contact:  Good  Speech:  Clear and Coherent  Volume:  Normal  Mood:  Euthymic  Affect:  Appropriate  Thought Process:  Coherent  Orientation:  Full (Time, Place, and Person)  Thought Content: Logical   Suicidal Thoughts:  No  Homicidal Thoughts:  No  Memory:  Immediate;   Good Recent;   Good  Judgement:  Fair  Insight:  Shallow  Psychomotor Activity:  Normal  Concentration:  Concentration: Fair  Recall:  Fair  Fund of Knowledge: Fair  Language: Good  Akathisia:  No  Handed:    AIMS (if indicated): not done  Assets:  Communication Skills Desire for Improvement Housing Intimacy Resilience Social Support  ADL's:  Intact  Cognition: WNL  Sleep:  Good    Screenings: Flowsheet Row ED from 11/20/2021 in [facility] Emergency Department at [facility] ED from 08/25/2021 in [facility] Emergency Department at [facility] ED from 03/16/2021 in [facility] Emergency Department at [facility]  C-SSRS RISK CATEGORY No Risk No Risk No Risk  Assessment and Plan: Based on assessment today patient appears to be stable on current regimen, she is able to continue doing well in the workplace.  While patient does continue to endorse reported hallucinations these appear to be comforting and more so related to the trauma of losing one of her children.  At this time no medication adjustments are recommended as patient is no longer endorsing significant irritability and is able to maintain social functioning.  We will reach out to patient's PCP regarding interest in contraceptives since patient would like to continue being on Depakote.  This was requested by patient herself.  Will also reach out to patient's PCP regarding Depakote level and CMP, as patient reports that she did get them drawn.  Discussed with patient that if provider is unable to get these labs, patient is willing to come into the clinic to get them drawn.  Also discussed with patient the importance of being on contraceptive therapy, patient endorsed understanding and again reiterated that she would like for provider to talk to her PCP about this, since provider will be reaching out about labs.  Chronic PTSD Schizoaffective bipolar type ( R/ O Bipolar disorder) General Personality disorder -Continue Seroquel to 400mg  QHS - Continue Depakote ER 500 mg nightly   Collaboration of Care: Collaboration of Care:   Patient/Guardian was advised Release of Information must be obtained prior to any record release in order to collaborate their care with an outside provider. Patient/Guardian was advised if they have not already done so to contact the registration department to  sign all necessary forms in order for [place] to release information regarding their care.   Consent: Patient/Guardian gives verbal consent for treatment and assignment of benefits for services provided during this visit. Patient/Guardian expressed understanding and agreed to proceed.   PGY-3 Kendra Morton, MD 07/03/2022, 5:43 PM

## 2022-07-04 ENCOUNTER — Other Ambulatory Visit (HOSPITAL_COMMUNITY): Payer: Self-pay | Admitting: Student in an Organized Health Care Education/Training Program

## 2022-07-04 ENCOUNTER — Telehealth (HOSPITAL_COMMUNITY): Payer: Self-pay | Admitting: Student in an Organized Health Care Education/Training Program

## 2022-07-04 DIAGNOSIS — Z5181 Encounter for therapeutic drug level monitoring: Secondary | ICD-10-CM

## 2022-07-04 NOTE — Telephone Encounter (Signed)
Provider was able to speak directly with patient's PCP Norva Riffle, PA who confirmed that she had a CMP , but was not able to get a Depakote level. PCP also endorsed that she will get a referral for patient to OBGYN, to be on some sort of contraceptive therapy given that patient smokes and  has been struggling cessation. PCP will likely be reaching out to schedule another appt with patient soon. Provider appreciated call.   This provider will have patient scheduled for Depakote level in office. Provider communicated this with patient, and patient was appreciative. Due to patient's QHS dosing, she is able to come any time of day for her lab work    PGY-3 Eliseo Gum, MD

## 2022-07-08 ENCOUNTER — Ambulatory Visit (HOSPITAL_COMMUNITY): Payer: 59

## 2022-07-17 DIAGNOSIS — D508 Other iron deficiency anemias: Secondary | ICD-10-CM | POA: Diagnosis not present

## 2022-07-17 DIAGNOSIS — J45909 Unspecified asthma, uncomplicated: Secondary | ICD-10-CM | POA: Diagnosis not present

## 2022-07-17 DIAGNOSIS — I10 Essential (primary) hypertension: Secondary | ICD-10-CM | POA: Diagnosis not present

## 2022-07-17 DIAGNOSIS — K219 Gastro-esophageal reflux disease without esophagitis: Secondary | ICD-10-CM | POA: Diagnosis not present

## 2022-07-17 DIAGNOSIS — R7303 Prediabetes: Secondary | ICD-10-CM | POA: Diagnosis not present

## 2022-07-17 DIAGNOSIS — R569 Unspecified convulsions: Secondary | ICD-10-CM | POA: Diagnosis not present

## 2022-07-17 DIAGNOSIS — M25511 Pain in right shoulder: Secondary | ICD-10-CM | POA: Diagnosis not present

## 2022-07-17 DIAGNOSIS — R110 Nausea: Secondary | ICD-10-CM | POA: Diagnosis not present

## 2022-07-17 DIAGNOSIS — E782 Mixed hyperlipidemia: Secondary | ICD-10-CM | POA: Diagnosis not present

## 2022-07-17 DIAGNOSIS — G8929 Other chronic pain: Secondary | ICD-10-CM | POA: Diagnosis not present

## 2022-07-22 ENCOUNTER — Other Ambulatory Visit: Payer: Self-pay | Admitting: Physician Assistant

## 2022-07-22 DIAGNOSIS — Z1231 Encounter for screening mammogram for malignant neoplasm of breast: Secondary | ICD-10-CM

## 2022-07-25 DIAGNOSIS — J45909 Unspecified asthma, uncomplicated: Secondary | ICD-10-CM | POA: Diagnosis not present

## 2022-07-25 DIAGNOSIS — R7303 Prediabetes: Secondary | ICD-10-CM | POA: Diagnosis not present

## 2022-07-25 DIAGNOSIS — R110 Nausea: Secondary | ICD-10-CM | POA: Diagnosis not present

## 2022-07-25 DIAGNOSIS — M25511 Pain in right shoulder: Secondary | ICD-10-CM | POA: Diagnosis not present

## 2022-07-25 DIAGNOSIS — K219 Gastro-esophageal reflux disease without esophagitis: Secondary | ICD-10-CM | POA: Diagnosis not present

## 2022-07-25 DIAGNOSIS — G8929 Other chronic pain: Secondary | ICD-10-CM | POA: Diagnosis not present

## 2022-07-25 DIAGNOSIS — R569 Unspecified convulsions: Secondary | ICD-10-CM | POA: Diagnosis not present

## 2022-07-25 DIAGNOSIS — E782 Mixed hyperlipidemia: Secondary | ICD-10-CM | POA: Diagnosis not present

## 2022-07-25 DIAGNOSIS — D508 Other iron deficiency anemias: Secondary | ICD-10-CM | POA: Diagnosis not present

## 2022-07-25 DIAGNOSIS — I10 Essential (primary) hypertension: Secondary | ICD-10-CM | POA: Diagnosis not present

## 2022-08-01 ENCOUNTER — Ambulatory Visit (HOSPITAL_COMMUNITY): Payer: 59

## 2022-08-11 DIAGNOSIS — R112 Nausea with vomiting, unspecified: Secondary | ICD-10-CM | POA: Diagnosis not present

## 2022-08-13 DIAGNOSIS — M542 Cervicalgia: Secondary | ICD-10-CM | POA: Diagnosis not present

## 2022-08-13 DIAGNOSIS — M25511 Pain in right shoulder: Secondary | ICD-10-CM | POA: Diagnosis not present

## 2022-08-26 DIAGNOSIS — M25511 Pain in right shoulder: Secondary | ICD-10-CM | POA: Diagnosis not present

## 2022-08-27 ENCOUNTER — Ambulatory Visit (HOSPITAL_COMMUNITY): Payer: 59

## 2022-09-11 DIAGNOSIS — I10 Essential (primary) hypertension: Secondary | ICD-10-CM | POA: Diagnosis not present

## 2022-09-15 ENCOUNTER — Ambulatory Visit (HOSPITAL_COMMUNITY): Payer: 59 | Admitting: Student

## 2022-09-27 DIAGNOSIS — M5030 Other cervical disc degeneration, unspecified cervical region: Secondary | ICD-10-CM | POA: Diagnosis not present

## 2022-09-27 DIAGNOSIS — M25511 Pain in right shoulder: Secondary | ICD-10-CM | POA: Diagnosis not present

## 2022-10-09 DIAGNOSIS — K219 Gastro-esophageal reflux disease without esophagitis: Secondary | ICD-10-CM | POA: Diagnosis not present

## 2022-10-09 DIAGNOSIS — I10 Essential (primary) hypertension: Secondary | ICD-10-CM | POA: Diagnosis not present

## 2022-10-09 DIAGNOSIS — R051 Acute cough: Secondary | ICD-10-CM | POA: Diagnosis not present

## 2022-10-09 DIAGNOSIS — R569 Unspecified convulsions: Secondary | ICD-10-CM | POA: Diagnosis not present

## 2022-10-09 DIAGNOSIS — R062 Wheezing: Secondary | ICD-10-CM | POA: Diagnosis not present

## 2022-10-09 DIAGNOSIS — R7303 Prediabetes: Secondary | ICD-10-CM | POA: Diagnosis not present

## 2022-10-09 DIAGNOSIS — E782 Mixed hyperlipidemia: Secondary | ICD-10-CM | POA: Diagnosis not present

## 2022-10-09 DIAGNOSIS — D508 Other iron deficiency anemias: Secondary | ICD-10-CM | POA: Diagnosis not present

## 2022-10-09 DIAGNOSIS — J45909 Unspecified asthma, uncomplicated: Secondary | ICD-10-CM | POA: Diagnosis not present

## 2022-10-15 ENCOUNTER — Other Ambulatory Visit (HOSPITAL_COMMUNITY): Payer: Self-pay | Admitting: Psychiatry

## 2022-10-15 ENCOUNTER — Ambulatory Visit (HOSPITAL_BASED_OUTPATIENT_CLINIC_OR_DEPARTMENT_OTHER): Payer: 59

## 2022-10-15 ENCOUNTER — Ambulatory Visit (HOSPITAL_COMMUNITY): Payer: 59

## 2022-10-15 DIAGNOSIS — Z79899 Other long term (current) drug therapy: Secondary | ICD-10-CM | POA: Diagnosis not present

## 2022-10-15 DIAGNOSIS — Z5181 Encounter for therapeutic drug level monitoring: Secondary | ICD-10-CM

## 2022-10-15 NOTE — Progress Notes (Signed)
Patient came in for her due Depakote level today. Patient did not have any questions or concers. Patient was pleasant and polite. Labs drawn from patients right AC, she tolerated well and without complaint.

## 2022-10-16 ENCOUNTER — Ambulatory Visit (HOSPITAL_COMMUNITY): Payer: 59

## 2022-10-16 DIAGNOSIS — R112 Nausea with vomiting, unspecified: Secondary | ICD-10-CM | POA: Diagnosis not present

## 2022-10-16 DIAGNOSIS — K449 Diaphragmatic hernia without obstruction or gangrene: Secondary | ICD-10-CM | POA: Diagnosis not present

## 2022-10-16 LAB — VALPROIC ACID LEVEL: Valproic Acid Lvl: <4 ug/mL — ABNORMAL LOW (ref 50–100)

## 2022-10-17 ENCOUNTER — Other Ambulatory Visit: Payer: Self-pay | Admitting: Gastroenterology

## 2022-10-17 DIAGNOSIS — R1084 Generalized abdominal pain: Secondary | ICD-10-CM

## 2022-10-20 ENCOUNTER — Telehealth (HOSPITAL_COMMUNITY): Payer: Self-pay

## 2022-10-20 NOTE — Telephone Encounter (Signed)
Patient is requesting a refill on her Depakote and her Seroquel. Last appointment was with Gerlean Ren on 6/6, she sent a 30 day supply with 2 refills after that appointment. Patient was supposed to come in August to see Toni Amend but she cancelled. She also cancelled on me 5 times. Her Depakote level came back <4 and patient states that is because she throws it up. She is now scheduled for 9/30. Please review and advise.

## 2022-10-22 NOTE — Telephone Encounter (Signed)
Denied refill, patient will need to start over on her medications as a fresh slate, because it is unclear how patient has been taking her medications.

## 2022-10-23 DIAGNOSIS — I10 Essential (primary) hypertension: Secondary | ICD-10-CM | POA: Diagnosis not present

## 2022-10-23 DIAGNOSIS — R7303 Prediabetes: Secondary | ICD-10-CM | POA: Diagnosis not present

## 2022-10-23 DIAGNOSIS — J45909 Unspecified asthma, uncomplicated: Secondary | ICD-10-CM | POA: Diagnosis not present

## 2022-10-23 DIAGNOSIS — G8929 Other chronic pain: Secondary | ICD-10-CM | POA: Diagnosis not present

## 2022-10-23 DIAGNOSIS — G629 Polyneuropathy, unspecified: Secondary | ICD-10-CM | POA: Diagnosis not present

## 2022-10-23 DIAGNOSIS — E782 Mixed hyperlipidemia: Secondary | ICD-10-CM | POA: Diagnosis not present

## 2022-10-23 DIAGNOSIS — K219 Gastro-esophageal reflux disease without esophagitis: Secondary | ICD-10-CM | POA: Diagnosis not present

## 2022-10-23 DIAGNOSIS — R569 Unspecified convulsions: Secondary | ICD-10-CM | POA: Diagnosis not present

## 2022-10-23 DIAGNOSIS — D508 Other iron deficiency anemias: Secondary | ICD-10-CM | POA: Diagnosis not present

## 2022-10-27 ENCOUNTER — Encounter (HOSPITAL_COMMUNITY): Payer: Self-pay | Admitting: Student

## 2022-10-27 ENCOUNTER — Ambulatory Visit (HOSPITAL_BASED_OUTPATIENT_CLINIC_OR_DEPARTMENT_OTHER): Payer: 59 | Admitting: Student

## 2022-10-27 ENCOUNTER — Other Ambulatory Visit: Payer: Self-pay

## 2022-10-27 DIAGNOSIS — F4312 Post-traumatic stress disorder, chronic: Secondary | ICD-10-CM

## 2022-10-27 DIAGNOSIS — F250 Schizoaffective disorder, bipolar type: Secondary | ICD-10-CM

## 2022-10-27 MED ORDER — QUETIAPINE FUMARATE 400 MG PO TABS: 400.0000 mg | ORAL_TABLET | Freq: Every day | ORAL | 2 refills | Status: AC
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
## 2022-10-30 ENCOUNTER — Other Ambulatory Visit: Payer: 59

## 2022-10-30 NOTE — Addendum Note (Signed)
Addended by: Everlena Cooper on: 10/30/2022 01:17 PM   Modules accepted: Level of Service

## 2022-11-14 DIAGNOSIS — M25511 Pain in right shoulder: Secondary | ICD-10-CM | POA: Diagnosis not present

## 2022-11-14 DIAGNOSIS — M5030 Other cervical disc degeneration, unspecified cervical region: Secondary | ICD-10-CM | POA: Diagnosis not present

## 2022-11-18 ENCOUNTER — Ambulatory Visit
Admission: RE | Admit: 2022-11-18 | Discharge: 2022-11-18 | Disposition: A | Discharge status: Home / Self Care | Payer: 59 | Source: Ambulatory Visit | Attending: Gastroenterology | Admitting: Gastroenterology

## 2022-11-18 DIAGNOSIS — K449 Diaphragmatic hernia without obstruction or gangrene: Secondary | ICD-10-CM | POA: Diagnosis not present

## 2022-11-18 DIAGNOSIS — K219 Gastro-esophageal reflux disease without esophagitis: Secondary | ICD-10-CM | POA: Diagnosis not present

## 2022-11-18 DIAGNOSIS — R1084 Generalized abdominal pain: Secondary | ICD-10-CM

## 2022-11-18 DIAGNOSIS — R1310 Dysphagia, unspecified: Secondary | ICD-10-CM | POA: Diagnosis not present

## 2022-12-05 DIAGNOSIS — M25511 Pain in right shoulder: Secondary | ICD-10-CM | POA: Diagnosis not present

## 2022-12-08 ENCOUNTER — Telehealth (HOSPITAL_COMMUNITY): Payer: 59 | Admitting: Student

## 2022-12-11 DIAGNOSIS — M25511 Pain in right shoulder: Secondary | ICD-10-CM | POA: Diagnosis not present

## 2022-12-12 DIAGNOSIS — M25511 Pain in right shoulder: Secondary | ICD-10-CM | POA: Diagnosis not present

## 2023-01-14 DIAGNOSIS — K449 Diaphragmatic hernia without obstruction or gangrene: Secondary | ICD-10-CM | POA: Diagnosis not present

## 2023-01-14 DIAGNOSIS — K219 Gastro-esophageal reflux disease without esophagitis: Secondary | ICD-10-CM | POA: Diagnosis not present

## 2023-01-15 DIAGNOSIS — M75101 Unspecified rotator cuff tear or rupture of right shoulder, not specified as traumatic: Secondary | ICD-10-CM | POA: Diagnosis not present

## 2023-01-15 DIAGNOSIS — M7541 Impingement syndrome of right shoulder: Secondary | ICD-10-CM | POA: Diagnosis not present

## 2023-01-29 ENCOUNTER — Other Ambulatory Visit (HOSPITAL_COMMUNITY): Payer: Self-pay | Admitting: Surgery

## 2023-01-29 DIAGNOSIS — R109 Unspecified abdominal pain: Secondary | ICD-10-CM

## 2023-02-02 ENCOUNTER — Encounter (HOSPITAL_COMMUNITY)
Admission: RE | Admit: 2023-02-02 | Discharge: 2023-02-02 | Disposition: A | Discharge status: Home / Self Care | Payer: 59 | Source: Ambulatory Visit | Attending: Surgery | Admitting: Surgery

## 2023-02-02 DIAGNOSIS — R109 Unspecified abdominal pain: Secondary | ICD-10-CM | POA: Insufficient documentation

## 2023-02-02 MED ORDER — TECHNETIUM TC 99M SULFUR COLLOID
2.0000 | Freq: Once | INTRAVENOUS | Status: AC | PRN
  Administered 2023-02-02: 2 via INTRAVENOUS

## 2023-03-02 ENCOUNTER — Other Ambulatory Visit: Payer: Self-pay | Admitting: Surgery

## 2023-03-02 DIAGNOSIS — R1110 Vomiting, unspecified: Secondary | ICD-10-CM

## 2023-03-06 ENCOUNTER — Ambulatory Visit
Admission: RE | Admit: 2023-03-06 | Discharge: 2023-03-06 | Disposition: A | Discharge status: Home / Self Care | Payer: 59 | Source: Ambulatory Visit | Attending: Surgery | Admitting: Surgery

## 2023-03-06 ENCOUNTER — Inpatient Hospital Stay: Admission: RE | Admit: 2023-03-06 | Payer: 59 | Source: Ambulatory Visit

## 2023-03-06 DIAGNOSIS — R1110 Vomiting, unspecified: Secondary | ICD-10-CM

## 2023-03-06 MED ORDER — IOPAMIDOL (ISOVUE-300) INJECTION 61%
100.0000 mL | Freq: Once | INTRAVENOUS | Status: AC | PRN
  Administered 2023-03-06: 100 mL via INTRAVENOUS

## 2023-03-18 ENCOUNTER — Encounter (HOSPITAL_COMMUNITY)
Admission: RE | Disposition: A | Discharge status: Home / Self Care | Payer: Self-pay | Source: Ambulatory Visit | Attending: Gastroenterology

## 2023-03-18 ENCOUNTER — Encounter (HOSPITAL_COMMUNITY): Payer: Self-pay | Admitting: Gastroenterology

## 2023-03-18 ENCOUNTER — Ambulatory Visit (HOSPITAL_COMMUNITY)
Admission: RE | Admit: 2023-03-18 | Discharge: 2023-03-18 | Disposition: A | Discharge status: Home / Self Care | Payer: 59 | Source: Ambulatory Visit | Attending: Gastroenterology | Admitting: Gastroenterology

## 2023-03-18 DIAGNOSIS — K219 Gastro-esophageal reflux disease without esophagitis: Secondary | ICD-10-CM | POA: Insufficient documentation

## 2023-03-18 DIAGNOSIS — K449 Diaphragmatic hernia without obstruction or gangrene: Secondary | ICD-10-CM | POA: Insufficient documentation

## 2023-03-18 HISTORY — PX: PH IMPEDANCE STUDY: SHX5565

## 2023-03-18 HISTORY — PX: ESOPHAGEAL MANOMETRY: SHX5429

## 2023-03-18 SURGERY — MANOMETRY, ESOPHAGUS

## 2023-03-18 MED ORDER — LIDOCAINE VISCOUS HCL 2 % MT SOLN
OROMUCOSAL | Status: AC
  Filled 2023-03-18: qty 15

## 2023-03-18 SURGICAL SUPPLY — 2 items
FACESHIELD LNG OPTICON STERILE (SAFETY) IMPLANT
GLOVE BIO SURGEON STRL SZ8 (GLOVE) ×2 IMPLANT

## 2023-03-18 NOTE — Progress Notes (Signed)
Esophageal Manometry done per protocol. Pt tolerated well without complication. Patient did have multiple episodes of regurgitation and vomiting during manometry study. Patient unable to tolerate rapid swallowing challenge. Ph with impedance done per protocol. Pt tolerated well. Instructions given regarding the study and monitor. Pt verbalized understand and return demonstrated use of monitor. Pt will return tomorrow to have probe removed and monitor downloaded.

## 2023-03-20 ENCOUNTER — Encounter (HOSPITAL_COMMUNITY): Payer: Self-pay | Admitting: Gastroenterology

## 2023-06-15 ENCOUNTER — Encounter (HOSPITAL_COMMUNITY): Payer: Self-pay | Admitting: Emergency Medicine

## 2023-06-15 ENCOUNTER — Emergency Department (HOSPITAL_COMMUNITY)
Admission: EM | Admit: 2023-06-15 | Discharge: 2023-06-15 | Disposition: A | Discharge status: Home / Self Care | Attending: Emergency Medicine | Admitting: Emergency Medicine

## 2023-06-15 DIAGNOSIS — W57XXXA Bitten or stung by nonvenomous insect and other nonvenomous arthropods, initial encounter: Secondary | ICD-10-CM | POA: Diagnosis not present

## 2023-06-15 DIAGNOSIS — Z91040 Latex allergy status: Secondary | ICD-10-CM | POA: Insufficient documentation

## 2023-06-15 DIAGNOSIS — S30860A Insect bite (nonvenomous) of lower back and pelvis, initial encounter: Secondary | ICD-10-CM | POA: Insufficient documentation

## 2023-06-15 DIAGNOSIS — Z7901 Long term (current) use of anticoagulants: Secondary | ICD-10-CM | POA: Insufficient documentation

## 2023-06-15 MED ORDER — DOXYCYCLINE HYCLATE 100 MG PO CAPS: 100.0000 mg | ORAL_CAPSULE | Freq: Two times a day (BID) | ORAL | 0 refills | Status: AC

## 2023-06-15 NOTE — Discharge Instructions (Signed)
 You were seen in the ER today for concerns of a tick bite. You do not appear to have any portion of a tick retained in your skin, but it appears that you do have some irritation developing in the skin surrounding this area. I have started you on doxycycline  for this suspected tick bite. Please take this as prescribed. For any concerns of new or worsening symptoms, return to the ER.

## 2023-06-15 NOTE — ED Provider Notes (Signed)
 [place] EMERGENCY DEPARTMENT AT [facility] Provider Note   CSN: [id number] Arrival date & time: 06/15/23  2055     History Chief Complaint  Patient presents with   Insect Bite    Kendra Roberts is a 48 y.o. female. Patient with history of schizoaffective disorder, constipation, here with concerns of an insect bite.  She reports that she initially noticed insect bite about 1 or 2 days ago and she believes that it was a tick.  She is able to move it but she does report some swelling and pain to this area now.  Denies any fever, chills, or bodyaches.  No sore throat or myalgias.  HPI     Home Medications Prior to Admission medications   Medication Sig Start Date End Date Taking? Authorizing Provider  doxycycline  (VIBRAMYCIN ) 100 MG capsule Take 1 capsule (100 mg total) by mouth 2 (two) times daily for 7 days. 06/15/23 06/22/23 Yes Rickie Gange A, PA-C  albuterol  (PROVENTIL  HFA;VENTOLIN  HFA) 108 (90 BASE) MCG/ACT inhaler Inhale 2 puffs into the lungs every 6 (six) hours as needed for wheezing.    [provider]  albuterol  (PROVENTIL ) (2.5 MG/3ML) 0.083% nebulizer solution Take 2.5 mg by nebulization every 6 (six) hours as needed for wheezing or shortness of breath.    [provider]  AMLODIPINE BENZOATE PO Take 5 mg by mouth.    [provider]  cholecalciferol (VITAMIN D3) 25 MCG (1000 UNIT) tablet Take 1,000 Units by mouth daily. Patient not taking: Reported on 10/27/2022    [provider]  clindamycin  (CLEOCIN ) 300 MG capsule Take 1 capsule (300 mg total) by mouth 3 (three) times daily. Patient not taking: Reported on 10/27/2022 02/01/21   Deatra Face, MD  cyclobenzaprine  (FLEXERIL ) 10 MG tablet Take 1 tablet (10 mg total) by mouth 2 (two) times daily as needed for muscle spasms. Patient not taking: Reported on 10/27/2022 03/16/21   Jere Monaco, MD  fluticasone (FLONASE) 50 MCG/ACT nasal spray Place 1 spray into both nostrils  daily. 01/15/21   [provider]  gabapentin (NEURONTIN) 400 MG capsule Take 400 mg by mouth 3 (three) times daily. Patient reports she is not taking currently, but has refills 04/17/19   [provider]  montelukast (SINGULAIR) 10 MG tablet Take 10 mg by mouth daily.  06/10/19   [provider]  Multiple Vitamin (MULTIVITAMIN WITH MINERALS) TABS tablet Take 1 tablet by mouth daily.    [provider]  pantoprazole (PROTONIX) 40 MG tablet Take 40 mg by mouth daily. 06/03/19   [provider]  QUEtiapine  (SEROQUEL ) 400 MG tablet Take 1 tablet (400 mg total) by mouth at bedtime. 10/27/22 01/25/23  Shery Done, MD  rivaroxaban  (XARELTO ) 10 MG TABS tablet Take 1 tablet (10 mg total) by mouth daily. 11/21/21   Clark, Meghan R, PA-C  traMADol  (ULTRAM ) 50 MG tablet Take 1 tablet (50 mg total) by mouth every 6 (six) hours as needed. 11/21/21   Clark, Meghan R, PA-C  WIXELA INHUB 250-50 MCG/DOSE AEPB Inhale 1 puff into the lungs 2 (two) times daily.  04/19/19   [provider]      Allergies    Latex and Penicillins    Review of Systems   Review of Systems  Skin:  Positive for wound.  All other systems reviewed and are negative.   Physical Exam Updated Vital Signs BP (!) 146/92   Pulse 93   Temp 97.8 F (36.6 C) (Oral)  Resp 17   LMP 06/11/2023   SpO2 100%  Physical Exam Vitals and nursing note reviewed.  Constitutional:      General: She is not in acute distress.    Appearance: She is well-developed.  HENT:     Head: Normocephalic and atraumatic.  Eyes:     Conjunctiva/sclera: Conjunctivae normal.  Cardiovascular:     Rate and Rhythm: Normal rate and regular rhythm.     Heart sounds: No murmur heard. Pulmonary:     Effort: Pulmonary effort is normal. No respiratory distress.     Breath sounds: Normal breath sounds.  Abdominal:     Palpations: Abdomen is soft.     Tenderness: There is no abdominal tenderness.   Musculoskeletal:        General: No swelling.     Cervical back: Neck supple.  Skin:    General: Skin is warm and dry.     Capillary Refill: Capillary refill takes less than 2 seconds.     Findings: Lesion present.          Comments: Small area of erythema and induration to the right lower back.  No appreciable retained foreign body or insect.  No drainage present from the wound.  Neurological:     Mental Status: She is alert.  Psychiatric:        Mood and Affect: Mood normal.     ED Results / Procedures / Treatments   Labs (all labs ordered are listed, but only abnormal results are displayed) Labs Reviewed - No data to display  EKG None  Radiology No results found.  Procedures .Ultrasound ED Soft Tissue  Date/Time: 06/15/2023 11:16 PM  Performed by: Erikka Follmer A, PA-C Authorized by: Rickie Gange A, PA-C   Procedure details:    Indications: evaluate for foreign body     Transverse view:  Visualized   Longitudinal view:  Visualized   Images: not archived     Limitations:  Body habitus and positioning Location:    Location: lower back     Side:  Right Findings:     no abscess present    no cellulitis present    no foreign body present    Medications Ordered in ED Medications - No data to display  ED Course/ Medical Decision Making/ A&P                                Medical Decision Making Risk Prescription drug management.   This patient presents to the ED for concern of insect bite.  Differential diagnosis includes tickborne illness, recommend spotted fever, cellulitis, retained foreign body   Problem List / ED Course:  Patient presents emergency department today with concerns of an insect bite.  States this is ongoing for last 1 to 2 days.  Reports that she did see a tick on her and had a removed but is concerned for possible retained part of the tick from her skin. On exam, there is erythema and induration to the area from the suspected  insect bite but no obvious foreign body or drainage present. Bedside US  performed with no signs of any abscess or foreign body. Suspect this is an uncomplicated tick bite at this time as there are no systemic signs of illness. Will start patient on doxycycline . Advised patient to return to the ER for concerns of new or worsening symptoms. Otherwise stable at this time and discharged home with plans for  outpatient follow up.   Final Clinical Impression(s) / ED Diagnoses Final diagnoses:  Tick bite of lower back, initial encounter    Rx / DC Orders ED Discharge Orders          Ordered    doxycycline  (VIBRAMYCIN ) 100 MG capsule  2 times daily        06/15/23 2221              Jaysin Gayler A, PA-C 06/15/23 2317    Afton Horse T, DO 06/22/23 0720

## 2023-06-15 NOTE — ED Triage Notes (Signed)
 PT reports thinks she got bitten by tick and still in there. No bite seen.
